# Patient Record
Sex: Female | Born: 1995 | Race: White | Hispanic: No | Marital: Single
Health system: Midwestern US, Community
[De-identification: ages and names within clinical notes are randomized; demographics above are authoritative.]

## PROBLEM LIST (undated history)

## (undated) DIAGNOSIS — M549 Dorsalgia, unspecified: Secondary | ICD-10-CM

## (undated) DIAGNOSIS — F909 Attention-deficit hyperactivity disorder, unspecified type: Secondary | ICD-10-CM

## (undated) DIAGNOSIS — F419 Anxiety disorder, unspecified: Secondary | ICD-10-CM

## (undated) HISTORY — PX: NO PAST SURGERIES: SHX2092

---
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
## 2018-12-19 NOTE — L&D Delivery Note (Signed)
Pt presented to L&D with AROM. She had pitocin augmentation started . She progressed along a nl labor curve. She had prolonged decel immediately after epidural placement. She pushed for 30 min and had a SVD of one live viable female infant over a 2nd degree midline epis in the ROA position. Placenta-S/I. EBL-400cc. Epis closed with 3-0 chromic. Baby to [facility]

## 2018-12-24 ENCOUNTER — Encounter (HOSPITAL_COMMUNITY): Payer: Self-pay | Admitting: Emergency Medicine

## 2018-12-24 ENCOUNTER — Emergency Department (HOSPITAL_COMMUNITY)
Admission: EM | Admit: 2018-12-24 | Discharge: 2018-12-24 | Disposition: A | Discharge status: Home / Self Care | Payer: Managed Care, Other (non HMO) | Attending: Emergency Medicine | Admitting: Emergency Medicine

## 2018-12-24 ENCOUNTER — Other Ambulatory Visit: Payer: Self-pay

## 2018-12-24 DIAGNOSIS — O26891 Other specified pregnancy related conditions, first trimester: Secondary | ICD-10-CM | POA: Diagnosis not present

## 2018-12-24 DIAGNOSIS — Z3A01 Less than 8 weeks gestation of pregnancy: Secondary | ICD-10-CM | POA: Diagnosis not present

## 2018-12-24 DIAGNOSIS — R1013 Epigastric pain: Secondary | ICD-10-CM | POA: Diagnosis not present

## 2018-12-24 LAB — CBC WITH DIFFERENTIAL/PLATELET
Abs Immature Granulocytes: 0.02 10*3/uL (ref 0.00–0.07)
BASOS ABS: 0 10*3/uL (ref 0.0–0.1)
Basophils Relative: 0 %
EOS ABS: 0.1 10*3/uL (ref 0.0–0.5)
EOS PCT: 1 %
HEMATOCRIT: 39.2 % (ref 36.0–46.0)
Hemoglobin: 12.8 g/dL (ref 12.0–15.0)
IMMATURE GRANULOCYTES: 0 %
LYMPHS ABS: 2.6 10*3/uL (ref 0.7–4.0)
LYMPHS PCT: 26 %
MCH: 28.6 pg (ref 26.0–34.0)
MCHC: 32.7 g/dL (ref 30.0–36.0)
MCV: 87.7 fL (ref 80.0–100.0)
Monocytes Absolute: 0.7 10*3/uL (ref 0.1–1.0)
Monocytes Relative: 7 %
NRBC: 0 % (ref 0.0–0.2)
Neutro Abs: 6.6 10*3/uL (ref 1.7–7.7)
Neutrophils Relative %: 66 %
PLATELETS: 256 10*3/uL (ref 150–400)
RBC: 4.47 MIL/uL (ref 3.87–5.11)
RDW: 13.3 % (ref 11.5–15.5)
WBC: 10 10*3/uL (ref 4.0–10.5)

## 2018-12-24 LAB — URINALYSIS, ROUTINE W REFLEX MICROSCOPIC
BILIRUBIN URINE: NEGATIVE
Glucose, UA: NEGATIVE mg/dL
Hgb urine dipstick: NEGATIVE
KETONES UR: NEGATIVE mg/dL
Leukocytes, UA: NEGATIVE
NITRITE: NEGATIVE
PH: 5 (ref 5.0–8.0)
Protein, ur: NEGATIVE mg/dL
SPECIFIC GRAVITY, URINE: 1.02 (ref 1.005–1.030)

## 2018-12-24 LAB — COMPREHENSIVE METABOLIC PANEL
ALBUMIN: 3.9 g/dL (ref 3.5–5.0)
ALT: 16 U/L (ref 0–44)
ANION GAP: 8 (ref 5–15)
AST: 16 U/L (ref 15–41)
Alkaline Phosphatase: 46 U/L (ref 38–126)
BUN: 12 mg/dL (ref 6–20)
CALCIUM: 8.7 mg/dL — AB (ref 8.9–10.3)
CHLORIDE: 107 mmol/L (ref 98–111)
CO2: 21 mmol/L — AB (ref 22–32)
CREATININE: 0.58 mg/dL (ref 0.44–1.00)
GFR calc Af Amer: >60 mL/min (ref >60)
GFR calc non Af Amer: >60 mL/min (ref >60)
GLUCOSE: 101 mg/dL — AB (ref 70–99)
POTASSIUM: 3.7 mmol/L (ref 3.5–5.1)
SODIUM: 136 mmol/L (ref 135–145)
Total Bilirubin: 0.9 mg/dL (ref 0.3–1.2)
Total Protein: 7 g/dL (ref 6.5–8.1)

## 2018-12-24 LAB — POC URINE PREG, ED: Preg Test, Ur: POSITIVE — AB

## 2018-12-24 LAB — LIPASE, BLOOD: Lipase: 34 U/L (ref 11–51)

## 2018-12-24 MED ORDER — SUCRALFATE 1 G PO TABS: 1.0000 g | ORAL_TABLET | Freq: Three times a day (TID) | ORAL | 0 refills | Status: DC

## 2018-12-24 MED ORDER — DOXYLAMINE-PYRIDOXINE 10-10 MG PO TBEC: 2.0000 | DELAYED_RELEASE_TABLET | Freq: Every evening | ORAL | 0 refills | Status: DC | PRN

## 2018-12-24 MED ORDER — ALUM & MAG HYDROXIDE-SIMETH 200-200-20 MG/5ML PO SUSP
15.0000 mL | Freq: Once | ORAL | Status: AC
  Administered 2018-12-24: 15 mL via ORAL
  Filled 2018-12-24: qty 30

## 2018-12-24 MED ORDER — SUCRALFATE 1 G PO TABS
1.0000 g | ORAL_TABLET | Freq: Once | ORAL | Status: AC
  Administered 2018-12-24: 1 g via ORAL
  Filled 2018-12-24: qty 1

## 2018-12-24 MED ORDER — ALUMINUM HYDROXIDE GEL 320 MG/5ML PO SUSP
15.0000 mL | Freq: Once | ORAL | Status: DC
  Filled 2018-12-24: qty 30

## 2018-12-24 NOTE — ED Triage Notes (Signed)
Pt from home with c/o abdominal pain (stabbing, sharp) since 0830 last night. Pt states she is pregnant 6-8 weeks, followed by OBGYN.  Pt c/o n/v for same time, emesis x3.  Denies vaginal bleeding.

## 2018-12-24 NOTE — Discharge Instructions (Addendum)
Go to a drugstore and buy unisom and vitaminb6(pyridoxine) Take 1/2 a tab of unisom(12.5mg ) and 25mg  of vitamin b6.  Take this at night before bed.  If you continue to have nausea and vomiting take twice a day.  [facility] or [facility].  Try the sucralfate.  Try to avoid things that may make this worse, most commonly these are spicy foods tomato based products fatty foods chocolate and peppermint.  Alcohol and tobacco can also make this worse.  Return to the emergency department for sudden worsening pain fever or inability to eat or drink.

## 2018-12-24 NOTE — ED Provider Notes (Signed)
[facility]-EMERGENCY DEPT Provider Note   CSN: [id number] Arrival date & time: 12/24/18  0903     History   Chief Complaint Chief Complaint  Patient presents with  . Abdominal Pain    HPI Catherine Tucker is a 23 y.o. female.  23 yo F with chief complaints of epigastric abdominal pain.  Going on since yesterday.  Patient has had some morning sickness.  She was recently confirmed IUP.  Her OBs office estimated about 6-1/2 weeks.  She denies lower abdominal pain vaginal bleeding or discharge.  The history is provided by the patient.  Abdominal Pain   This is a new problem. The current episode started yesterday. The problem occurs constantly. The problem has not changed since onset.The pain is located in the epigastric region. The quality of the pain is burning. The pain is at a severity of 6/10. The pain is moderate. Associated symptoms include nausea and vomiting. Pertinent negatives include fever, dysuria, headaches, arthralgias and myalgias. Nothing aggravates the symptoms. Nothing relieves the symptoms.    No past medical history on file.  There are no active problems to display for this patient.   No past surgical history on file.   OB History    Gravida  1   Para      Term      Preterm      AB      Living        SAB      TAB      Ectopic      Multiple      Live Births               Home Medications    Prior to Admission medications   Medication Sig Start Date End Date Taking? Authorizing Provider  Doxylamine-Pyridoxine 10-10 MG TBEC Take 2 tablets by mouth at bedtime as needed. 12/24/18   Melene PlanFloyd, Ajia Chadderdon, DO  sucralfate (CARAFATE) 1 g tablet Take 1 tablet (1 g total) by mouth 4 (four) times daily -  with meals and at bedtime. 12/24/18   Melene PlanFloyd, Anicia Leuthold, DO    Family History No family history on file.  Social History Social History   Tobacco Use  . Smoking status: Not on file  Substance Use Topics  . Alcohol use: Not on file  .  Drug use: Not on file     Allergies   Patient has no known allergies.   Review of Systems Review of Systems  Constitutional: Negative for chills and fever.  HENT: Negative for congestion and rhinorrhea.   Eyes: Negative for redness and visual disturbance.  Respiratory: Negative for shortness of breath and wheezing.   Cardiovascular: Negative for chest pain and palpitations.  Gastrointestinal: Positive for abdominal pain, nausea and vomiting.  Genitourinary: Negative for dysuria and urgency.  Musculoskeletal: Negative for arthralgias and myalgias.  Skin: Negative for pallor and wound.  Neurological: Negative for dizziness and headaches.     Physical Exam Updated Vital Signs BP 129/89   Pulse 72   Temp (!) 97.5 F (36.4 C) (Oral)   Ht 5\' 2"  (1.575 m)   Wt 53.1 kg   SpO2 100%   BMI 21.40 kg/m   Physical Exam Vitals signs and nursing note reviewed.  Constitutional:      General: She is not in acute distress.    Appearance: She is well-developed. She is not diaphoretic.  HENT:     Head: Normocephalic and atraumatic.  Eyes:  Pupils: Pupils are equal, round, and reactive to light.  Neck:     Musculoskeletal: Normal range of motion and neck supple.  Cardiovascular:     Rate and Rhythm: Normal rate and regular rhythm.     Heart sounds: No murmur. No friction rub. No gallop.   Pulmonary:     Effort: Pulmonary effort is normal.     Breath sounds: No wheezing or rales.  Abdominal:     General: There is no distension.     Palpations: Abdomen is soft.     Tenderness: There is abdominal tenderness in the epigastric area. Negative signs include Murphy's sign and McBurney's sign.  Musculoskeletal:        General: No tenderness.  Skin:    General: Skin is warm and dry.  Neurological:     Mental Status: She is alert and oriented to person, place, and time.  Psychiatric:        Behavior: Behavior normal.      ED Treatments / Results  Labs (all labs ordered are  listed, but only abnormal results are displayed) Labs Reviewed  COMPREHENSIVE METABOLIC PANEL - Abnormal; Notable for the following components:      Result Value   CO2 21 (*)    Glucose, Bld 101 (*)    Calcium 8.7 (*)    All other components within normal limits  POC URINE PREG, ED - Abnormal; Notable for the following components:   Preg Test, Ur POSITIVE (*)    All other components within normal limits  URINALYSIS, ROUTINE W REFLEX MICROSCOPIC  CBC WITH DIFFERENTIAL/PLATELET  LIPASE, BLOOD    EKG None  Radiology No results found.  Procedures Procedures (including critical care time)  Medications Ordered in ED Medications  sucralfate (CARAFATE) tablet 1 g (1 g Oral Given 12/24/18 0946)  alum & mag hydroxide-simeth (MAALOX/MYLANTA) 200-200-20 MG/5ML suspension 15 mL (15 mLs Oral Given 12/24/18 0946)     Initial Impression / Assessment and Plan / ED Course  I have reviewed the triage vital signs and the nursing notes.  Pertinent labs & imaging results that were available during my care of the patient were reviewed by me and considered in my medical decision making (see chart for details).     23 yo F with a cc of epigastric abdominal pain. Going on for the past couple days.  Recently pregnant, G1P0, some morning sickness but no other issues yet.  No prior medical problems.   Mild epigastric pain.  No RUQ tenderness.  Likely gastritis with frequent vomiting, will give maalox, sucralfate, lab eval.  Reassess.   Lab work here is unremarkable.  The patient has had some mild if any improvement with aluminum hydroxide and sucrafate.  10:31 AM:  I have discussed the diagnosis/risks/treatment options with the patient and family and believe the pt to be eligible for discharge home to follow-up with PCP. We also discussed returning to the ED immediately if new or worsening sx occur. We discussed the sx which are most concerning (e.g., sudden worsening pain, fever, inability to tolerate  by mouth) that necessitate immediate return. Medications administered to the patient during their visit and any new prescriptions provided to the patient are listed below.  Medications given during this visit Medications  sucralfate (CARAFATE) tablet 1 g (1 g Oral Given 12/24/18 0946)  alum & mag hydroxide-simeth (MAALOX/MYLANTA) 200-200-20 MG/5ML suspension 15 mL (15 mLs Oral Given 12/24/18 0946)      The patient appears reasonably screen and/or stabilized for  discharge and I doubt any other medical condition or other [facility] requiring further screening, evaluation, or treatment in the ED at this time prior to discharge.    Final Clinical Impressions(s) / ED Diagnoses   Final diagnoses:  Epigastric abdominal pain    ED Discharge Orders         Ordered    sucralfate (CARAFATE) 1 g tablet  3 times daily with meals & bedtime     12/24/18 1029    Doxylamine-Pyridoxine 10-10 MG TBEC  At bedtime PRN     12/24/18 1029           Melene PlanFloyd, Juleen Sorrels, DO 12/24/18 1031

## 2019-08-03 ENCOUNTER — Other Ambulatory Visit: Payer: Self-pay

## 2019-08-03 ENCOUNTER — Inpatient Hospital Stay (HOSPITAL_COMMUNITY): Payer: Managed Care, Other (non HMO) | Admitting: Anesthesiology

## 2019-08-03 ENCOUNTER — Encounter (HOSPITAL_COMMUNITY): Payer: Self-pay

## 2019-08-03 ENCOUNTER — Inpatient Hospital Stay (HOSPITAL_COMMUNITY)
Admission: AD | Admit: 2019-08-03 | Discharge: 2019-08-04 | DRG: 807 | Disposition: A | Discharge status: Home / Self Care | Payer: Managed Care, Other (non HMO) | Attending: Obstetrics and Gynecology | Admitting: Obstetrics and Gynecology

## 2019-08-03 DIAGNOSIS — O26893 Other specified pregnancy related conditions, third trimester: Secondary | ICD-10-CM | POA: Diagnosis present

## 2019-08-03 DIAGNOSIS — Z3490 Encounter for supervision of normal pregnancy, unspecified, unspecified trimester: Secondary | ICD-10-CM

## 2019-08-03 DIAGNOSIS — Z20828 Contact with and (suspected) exposure to other viral communicable diseases: Secondary | ICD-10-CM | POA: Diagnosis present

## 2019-08-03 DIAGNOSIS — Z3A39 39 weeks gestation of pregnancy: Secondary | ICD-10-CM

## 2019-08-03 HISTORY — DX: Dorsalgia, unspecified: M54.9

## 2019-08-03 HISTORY — DX: Anxiety disorder, unspecified: F41.9

## 2019-08-03 LAB — CBC
HCT: 36.5 % (ref 36.0–46.0)
Hemoglobin: 12.2 g/dL (ref 12.0–15.0)
MCH: 29.1 pg (ref 26.0–34.0)
MCHC: 33.4 g/dL (ref 30.0–36.0)
MCV: 87.1 fL (ref 80.0–100.0)
Platelets: 217 10*3/uL (ref 150–400)
RBC: 4.19 MIL/uL (ref 3.87–5.11)
RDW: 14.8 % (ref 11.5–15.5)
WBC: 10.4 10*3/uL (ref 4.0–10.5)
nRBC: 0 % (ref 0.0–0.2)

## 2019-08-03 LAB — TYPE AND SCREEN
ABO/RH(D): O POS
Antibody Screen: NEGATIVE

## 2019-08-03 LAB — SARS CORONAVIRUS 2 BY RT PCR (HOSPITAL ORDER, PERFORMED IN ~~LOC~~ HOSPITAL LAB): SARS Coronavirus 2: NEGATIVE

## 2019-08-03 LAB — ABO/RH: ABO/RH(D): O POS

## 2019-08-03 LAB — POCT FERN TEST: POCT Fern Test: POSITIVE

## 2019-08-03 MED ORDER — FLEET ENEMA 7-19 GM/118ML RE ENEM: 1.0000 | ENEMA | RECTAL | Status: DC | PRN

## 2019-08-03 MED ORDER — OXYCODONE-ACETAMINOPHEN 5-325 MG PO TABS: 2.0000 | ORAL_TABLET | ORAL | Status: DC | PRN

## 2019-08-03 MED ORDER — IBUPROFEN 600 MG PO TABS
600.0000 mg | ORAL_TABLET | Freq: Four times a day (QID) | ORAL | Status: DC
  Administered 2019-08-03 – 2019-08-04 (×4): 600 mg via ORAL
  Filled 2019-08-03 (×4): qty 1

## 2019-08-03 MED ORDER — OXYCODONE-ACETAMINOPHEN 5-325 MG PO TABS: 1.0000 | ORAL_TABLET | ORAL | Status: DC | PRN

## 2019-08-03 MED ORDER — LIDOCAINE HCL (PF) 1 % IJ SOLN
30.0000 mL | INTRAMUSCULAR | Status: DC | PRN
  Administered 2019-08-03: 17:00:00 30 mL via SUBCUTANEOUS
  Filled 2019-08-03: qty 30

## 2019-08-03 MED ORDER — OXYTOCIN 40 UNITS IN NORMAL SALINE INFUSION - SIMPLE MED
1.0000 m[IU]/min | INTRAVENOUS | Status: DC
  Administered 2019-08-03: 2 m[IU]/min via INTRAVENOUS

## 2019-08-03 MED ORDER — SENNOSIDES-DOCUSATE SODIUM 8.6-50 MG PO TABS
2.0000 | ORAL_TABLET | ORAL | Status: DC
  Administered 2019-08-04: 2 via ORAL
  Filled 2019-08-03: qty 2

## 2019-08-03 MED ORDER — TERBUTALINE SULFATE 1 MG/ML IJ SOLN: 0.2500 mg | Freq: Once | INTRAMUSCULAR | Status: DC | PRN

## 2019-08-03 MED ORDER — OXYTOCIN 40 UNITS IN NORMAL SALINE INFUSION - SIMPLE MED: 2.5000 [IU]/h | INTRAVENOUS | Status: DC

## 2019-08-03 MED ORDER — EPHEDRINE 5 MG/ML INJ
10.0000 mg | INTRAVENOUS | Status: DC | PRN
  Filled 2019-08-03: qty 10

## 2019-08-03 MED ORDER — COCONUT OIL OIL: 1.0000 "application " | TOPICAL_OIL | Status: DC | PRN

## 2019-08-03 MED ORDER — OXYTOCIN BOLUS FROM INFUSION: 500.0000 mL | Freq: Once | INTRAVENOUS | Status: DC

## 2019-08-03 MED ORDER — DIBUCAINE (PERIANAL) 1 % EX OINT: 1.0000 "application " | TOPICAL_OINTMENT | CUTANEOUS | Status: DC | PRN

## 2019-08-03 MED ORDER — PHENYLEPHRINE 40 MCG/ML (10ML) SYRINGE FOR IV PUSH (FOR BLOOD PRESSURE SUPPORT)
80.0000 ug | PREFILLED_SYRINGE | INTRAVENOUS | Status: DC | PRN
  Administered 2019-08-03: 80 ug via INTRAVENOUS
  Filled 2019-08-03: qty 10

## 2019-08-03 MED ORDER — SIMETHICONE 80 MG PO CHEW: 80.0000 mg | CHEWABLE_TABLET | ORAL | Status: DC | PRN

## 2019-08-03 MED ORDER — LACTATED RINGERS IV SOLN: 500.0000 mL | INTRAVENOUS | Status: DC | PRN

## 2019-08-03 MED ORDER — EPHEDRINE 5 MG/ML INJ
10.0000 mg | INTRAVENOUS | Status: DC | PRN
  Administered 2019-08-03: 15:00:00 10 mg via INTRAVENOUS

## 2019-08-03 MED ORDER — SODIUM CHLORIDE (PF) 0.9 % IJ SOLN
INTRAMUSCULAR | Status: DC | PRN
  Administered 2019-08-03: 12 mL/h via EPIDURAL

## 2019-08-03 MED ORDER — LACTATED RINGERS IV SOLN
INTRAVENOUS | Status: DC
  Administered 2019-08-03 (×2): via INTRAVENOUS

## 2019-08-03 MED ORDER — ONDANSETRON HCL 4 MG/2ML IJ SOLN: 4.0000 mg | Freq: Four times a day (QID) | INTRAMUSCULAR | Status: DC | PRN

## 2019-08-03 MED ORDER — FENTANYL-BUPIVACAINE-NACL 0.5-0.125-0.9 MG/250ML-% EP SOLN
12.0000 mL/h | EPIDURAL | Status: DC | PRN
  Filled 2019-08-03: qty 250

## 2019-08-03 MED ORDER — SOD CITRATE-CITRIC ACID 500-334 MG/5ML PO SOLN: 30.0000 mL | ORAL | Status: DC | PRN

## 2019-08-03 MED ORDER — FENTANYL CITRATE (PF) 100 MCG/2ML IJ SOLN
100.0000 ug | INTRAMUSCULAR | Status: DC | PRN
  Administered 2019-08-03: 14:00:00 100 ug via INTRAVENOUS
  Filled 2019-08-03: qty 2

## 2019-08-03 MED ORDER — ZOLPIDEM TARTRATE 5 MG PO TABS: 5.0000 mg | ORAL_TABLET | Freq: Every evening | ORAL | Status: DC | PRN

## 2019-08-03 MED ORDER — TETANUS-DIPHTH-ACELL PERTUSSIS 5-2.5-18.5 LF-MCG/0.5 IM SUSP: 0.5000 mL | Freq: Once | INTRAMUSCULAR | Status: DC

## 2019-08-03 MED ORDER — PHENYLEPHRINE 40 MCG/ML (10ML) SYRINGE FOR IV PUSH (FOR BLOOD PRESSURE SUPPORT)
80.0000 ug | PREFILLED_SYRINGE | INTRAVENOUS | Status: DC | PRN
  Administered 2019-08-03: 80 ug via INTRAVENOUS

## 2019-08-03 MED ORDER — ONDANSETRON HCL 4 MG PO TABS: 4.0000 mg | ORAL_TABLET | ORAL | Status: DC | PRN

## 2019-08-03 MED ORDER — ONDANSETRON HCL 4 MG/2ML IJ SOLN: 4.0000 mg | INTRAMUSCULAR | Status: DC | PRN

## 2019-08-03 MED ORDER — WITCH HAZEL-GLYCERIN EX PADS: 1.0000 "application " | MEDICATED_PAD | CUTANEOUS | Status: DC | PRN

## 2019-08-03 MED ORDER — LACTATED RINGERS IV SOLN: 500.0000 mL | Freq: Once | INTRAVENOUS | Status: DC

## 2019-08-03 MED ORDER — ACETAMINOPHEN 325 MG PO TABS: 650.0000 mg | ORAL_TABLET | ORAL | Status: DC | PRN

## 2019-08-03 MED ORDER — BENZOCAINE-MENTHOL 20-0.5 % EX AERO
1.0000 "application " | INHALATION_SPRAY | CUTANEOUS | Status: DC | PRN
  Filled 2019-08-03: qty 56

## 2019-08-03 MED ORDER — DIPHENHYDRAMINE HCL 50 MG/ML IJ SOLN: 12.5000 mg | INTRAMUSCULAR | Status: DC | PRN

## 2019-08-03 MED ORDER — MEASLES, MUMPS & RUBELLA VAC IJ SOLR: 0.5000 mL | Freq: Once | INTRAMUSCULAR | Status: DC

## 2019-08-03 MED ORDER — LIDOCAINE HCL (PF) 1 % IJ SOLN
INTRAMUSCULAR | Status: DC | PRN
  Administered 2019-08-03: 11 mL via EPIDURAL

## 2019-08-03 MED ORDER — OXYTOCIN 40 UNITS IN NORMAL SALINE INFUSION - SIMPLE MED
INTRAVENOUS | Status: AC
  Filled 2019-08-03: qty 1000
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
## 2019-08-03 NOTE — Anesthesia Procedure Notes (Signed)
Epidural Patient location during procedure: OB Start time: 08/03/2019 2:47 PM End time: 08/03/2019 3:01 PM  Staffing Anesthesiologist: Lynda Rainwater, MD Performed: anesthesiologist   Preanesthetic Checklist Completed: patient identified, site marked, surgical consent, pre-op evaluation, timeout performed, IV checked, risks and benefits discussed and monitors and equipment checked  Epidural Patient position: sitting Prep: ChloraPrep Patient monitoring: heart rate, cardiac monitor, continuous pulse ox and blood pressure Approach: midline Location: L2-L3 Injection technique: LOR saline  Needle:  Needle type: Tuohy  Needle gauge: 17 G Needle length: 9 cm Needle insertion depth: 4 cm Catheter type: closed end flexible Catheter size: 20 Guage Catheter at skin depth: 8 cm Test dose: negative  Assessment Events: blood not aspirated, injection not painful, no injection resistance, negative IV test and no paresthesia  Additional Notes Reason for block:procedure for pain

## 2019-08-03 NOTE — MAU Note (Signed)
Went to office on Wednesday -was 5.5 cm but not having contractions. Water broke at 2000. Clear fluid.  Baby moving well. No bleeding.

## 2019-08-03 NOTE — Anesthesia Preprocedure Evaluation (Signed)

## 2019-08-04 LAB — RPR: RPR Ser Ql: NONREACTIVE

## 2019-08-04 MED ORDER — IBUPROFEN 600 MG PO TABS: 600.0000 mg | ORAL_TABLET | Freq: Four times a day (QID) | ORAL | 0 refills | Status: DC

## 2019-08-04 NOTE — Lactation Note (Signed)
This note was copied from a baby's chart. Lactation Consultation Note  Patient Name: Catherine Tucker GYBWL'S Date: 08/04/2019 Reason for consult: Follow-up assessment;Infant weight loss;Term;Primapara;1st time breastfeeding Baby is 10 hours old , mom desires early D/C today.  As LC entered the room baby  latched on the cross cradle With swallows , increased with breast compressions.  Per mom more comfortable after the folded up towels placed  Under the baby without disturbing the latch. Baby released  After 30 mins, nipple well rounded and mom expressed the latch  Was better than the baby has been doing.  LC assisted mom to latch on the left breast / football/ depth achieved  And increased swallows, baby released after 5 mins , fell asleep, and Nipple well rounded. Mom mentioned the latch was comfortable.  LC reviewed basics of latching , importance of STS feedings until the  Baby is back to birth weight,gaining steadily, and can stay awake for majority of feeding,  Sore nipple and engorgement prevention and tx reviewed.  Mom aware of the [facility] resources at [facility] .  Maternal Data Has patient been taught Hand Expression?: Yes  Feeding Feeding Type: Breast Fed(latched with multiple swallows)  LATCH Score Latch: Grasps breast easily, tongue down, lips flanged, rhythmical sucking.  Audible Swallowing: Spontaneous and intermittent  Type of Nipple: Everted at rest and after stimulation  Comfort (Breast/Nipple): Soft / non-tender  Hold (Positioning): Assistance needed to correctly position infant at breast and maintain latch.  LATCH Score: 9  Interventions Interventions: Breast feeding basics reviewed;Assisted with latch;Skin to skin;Breast massage;Hand express;Breast compression;Adjust position;Support pillows;Position options;Shells;Hand pump  Lactation Tools Discussed/Used Tools: Pump;Shells Shell Type: Inverted Breast pump type: Manual   Consult Status Consult Status:  Follow-up Date: 08/04/19 Follow-up type: In-patient    [place] 08/04/2019, 2:59 PM

## 2019-08-04 NOTE — Discharge Summary (Signed)
Obstetric Discharge Summary Reason for Admission: rupture of membranes Prenatal Procedures: NST Intrapartum Procedures: spontaneous vaginal delivery Postpartum Procedures: none Complications-Operative and Postpartum: 2nd midline epis Hemoglobin  Date Value Ref Range Status  08/03/2019 12.2 12.0 - 15.0 g/dL Final   HCT  Date Value Ref Range Status  08/03/2019 36.5 36.0 - 46.0 % Final    Physical Exam:  General: alert and cooperative Lochia: appropriate   Discharge Diagnoses: Term Pregnancy-delivered  Discharge Information: Date: 08/04/2019 Activity: pelvic rest Diet: routine Medications: PNV and Ibuprofen Condition: stable Instructions: refer to practice specific booklet Discharge to: home   Newborn Data: Live born female  Birth Weight: 6 lb 14.6 oz (3135 g) APGAR: 8, 9  Newborn Delivery   Birth date/time: 08/03/2019 16:39:00 Delivery type: Vaginal, Spontaneous      Home with mother.  [place] E 08/04/2019, 11:27 AM

## 2019-08-04 NOTE — Progress Notes (Signed)
PPD#1 Pt without complaints. Lochia mild VSSAF  Hgb-ok Does not desire circ IMP/ Stable Plan/ Will discharge

## 2019-08-04 NOTE — Clinical Social Work Maternal (Addendum)
CLINICAL SOCIAL WORK MATERNAL/CHILD NOTE  Patient Details  Name: Catherine Tucker MRN: [id number] Date of Birth: 03/22/1996  Date:  07/10/2019  Clinical Social Worker Initiating Note:  Catherine Tucker Date/Time: Initiated:  08/04/19/1145     Child's Name:  Catherine Tucker   Biological Parents:  Mother, Father(Catherine Tucker and Catherine Tucker DOB: 01/23/1996)   Need for Interpreter:  None   Reason for Referral:  Behavioral Health Concerns, Current Substance Use/Substance Use During Pregnancy    Address:  [address] [place] [address]    Phone number:  [phone] (home)     Additional phone number:   Household Members/Support Persons (HM/SP):   Household Member/Support Person 1   HM/SP Name Relationship DOB or Age  HM/SP -1   FOB    HM/SP -2        HM/SP -3        HM/SP -4        HM/SP -5        HM/SP -6        HM/SP -7        HM/SP -8          Natural Supports (not living in the home):  Parent, Immediate Family   Professional Supports: None   Employment: Animator   Type of Work: Medical illustrator with Airline pilot:  Attending college   Homebound arranged:    Museum/gallery curator Resources:  Multimedia programmer   Other Resources:      Cultural/Religious Considerations Which May Impact Care:    Strengths:  Ability to meet basic needs , Home prepared for child , Pediatrician chosen   Psychotropic Medications:         Pediatrician:    [place]olic[place] area  Pediatrician List:   [facility]  [place]      Pediatrician Fax Number:    Risk Factors/Current Problems:      Cognitive State:  Able to Concentrate , Alert , Linear Thinking    Mood/Affect:  Bright , Calm , Comfortable , Interested , Happy , Relaxed    CSW Assessment:  CSW received consult for history of anxiety and THC use.  CSW met with MOB to offer support and  complete assessment.    MOB resting in bed just finishing up breastfeeding infant with FOB present at bedside, when CSW entered the room. CSW introduced self and received verbal permission from MOB to have FOB step out of the room for CSW to complete assessment. FOB understanding and left voluntarily. MOB very pleasant and engaged throughout assessment. MOB appeared to be well-bonded and attached to infant during visit. MOB reported she currently lives with FOB/fiance in [place]. MOB stated she is still in college and works full-time as an Medical illustrator for [employer]. CSW inquired about MOB's mental health history and MOB denied having any but did acknowledge being diagnosed with ADHD. CSW provided education regarding the baby blues period vs. perinatal mood disorders, discussed treatment and gave resources for mental health follow up if concerns arise.  CSW recommends self-evaluation during the postpartum time period using the New Mom Checklist from Postpartum Progress and encouraged MOB to contact a medical professional if symptoms are noted at any time. MOB denied any current SI, HI or DV and reported having a good support system consisting of her mother, father and step-father.  CSW inquired  about MOB's substance use history and MOB acknowledged using marijuana in the beginning of her pregnancy but reported last use was around Thanksgiving. Per MOB, she has been around friends who smoke marijuana but denied any use herself. CSW informed MOB of Hospital Drug Policy and explained infant's UDS came back positive for THC. CSW explained that a CPS report would need to be made and detailed process. MOB very understanding and open to having CPS come meet with her and do a home visit. MOB denied any questions or concerns regarding policy.   MOB confirmed having all essential items for infant once discharged and reported infant would be sleeping in a basinet once home. CSW provided review of  Sudden Infant Death Syndrome (SIDS) precautions and safe sleeping habits.    CSW made [facility] CPS report due to infant's UDS being positive for THC. No barriers to discharge, at this time. CPS to follow up with MOB within 72 hours.   CSW Plan/Description:  No Further Intervention Required/No Barriers to Discharge, Sudden Infant Death Syndrome (SIDS) Education, Perinatal Mood and Anxiety Disorder (PMADs) Education, [place], Child Protective Service Report , CSW Will Continue to Monitor Umbilical Cord Tissue Drug Screen Results and Make Report if Catherine Tucker, [place] 03/02/2019, 12:12 PM

## 2019-08-04 NOTE — Lactation Note (Signed)
This note was copied from a baby's chart. Lactation Consultation Note  Patient Name: Catherine Tucker Date: 08/04/2019 Reason for consult: Initial assessment;Primapara;1st time breastfeeding;Term  7 hours old FT female who is being exclusively BF by his mother, she's a P1. Mom has Hx of MJ use, reported (+) breast changes during the pregnancy. [facility] taught mom how to hand express with the C hold. Mom able to teach back and she can easily get colostrum, LC showed parents how to finger feed baby. She has a hand pump from home that she brought to the hospital.  Offered assistance with latch and mom agreed to have baby STS; he wasn't cueing but was on his quiet alert state. LC took baby in football position to mother's right breast and he was able to latch with a deep asymmetric latch, wide open mouth and flanged lips. Reviewed key points for a deep latch. A few audible swallows noted upon breast compressions. Baby fell asleep shortly at the 5 minutes mark and self released from the breast.  Parents were very engaged during [facility] consultation and had lots of questions. Discussed normal newborn behavior, feeding cues, cluster feeding, lactogenesis II, pumping schedule and formula supplementation, mom's feeding choice on admission is to do both, breast and formula but she's planning on withhold from formula for now, at least till baby is 21 hours old. LC responded all parents questions to their satisfaction.  Feeding plan:  1. Encouraged mom to feed baby STS 8-12 times/24 hours or sooner if feeding cues are present 2. Hand expression/pumping and spoon/finger feeding were also encouraged  BF brochure, BF resources and feeding diary were reviewed. Parents reported all questions and concerns were answered, they're both aware of [place] OP services and will call PRN.  Maternal Data Formula Feeding for Exclusion: Yes Reason for exclusion: Mother's choice to formula and breast feed on admission Has patient been  taught Hand Expression?: Yes Does the patient have breastfeeding experience prior to this delivery?: No  Feeding Feeding Type: Breast Fed  LATCH Score Latch: Repeated attempts needed to sustain latch, nipple held in mouth throughout feeding, stimulation needed to elicit sucking reflex.  Audible Swallowing: A few with stimulation(with compressions)  Type of Nipple: Everted at rest and after stimulation  Comfort (Breast/Nipple): Soft / non-tender  Hold (Positioning): Assistance needed to correctly position infant at breast and maintain latch.  LATCH Score: 7  Interventions Interventions: Breast feeding basics reviewed;Assisted with latch;Skin to skin;Breast massage;Hand express;Breast compression;Adjust position;Support pillows  Lactation Tools Discussed/Used WIC Program: No   Consult Status Consult Status: Follow-up Date: 08/04/19 Follow-up type: In-patient    [place] 08/04/2019, 12:12 AM

## 2019-08-04 NOTE — Progress Notes (Signed)
CSW acknowledged consult and attempted to meet with MOB. However, MOB noted to be breastfeeding and requested CSW come back later.  CSW will meet with MOB at a later time.  Ndia Sampath, LCSWA  [facility] [phone]     

## 2019-08-05 ENCOUNTER — Ambulatory Visit: Payer: Self-pay

## 2019-08-05 NOTE — Lactation Note (Signed)
This note was copied from a baby's chart. Lactation Consultation Note  Patient Name: Catherine Tucker TXMIW'O Date: 08/05/2019 Reason for consult: Follow-up assessment;Infant weight loss;1st time breastfeeding;Primapara;Term  Visited with mom of a 77 hours old FT female who is now being partially BF and formula fed by his mother, she's a P1. Baby is at 9% weight loss and today but mom started supplementing with formula yesterday when baby started cluster feeding. Mom and baby won't be going home today but most likely they will tomorrow.  Baby already nursing when entering the room, LC assessed the latch and it was OK but could be deeper. Repositioned baby and he got a wider gap and kept sucking at the breast with a few audible swallows noted. After that baby self released; dad proceeded to burp baby, but asked LC to show them again how to do it. LC showed them two different ways of burping, parents aware that baby is at 9% weight loss and they will continue supplementing with Similac 20 after feeding at the breast. Reviewed formula supplementation guidelines according to baby's age in hours, normal newborn behavior and feeding cues.  Parents also requested assistance with formula supplementation, LC showed parents how to syringe feed baby, he took 12 ml of Similac 20 calorie formula. Mom may switch to a slow flow nipple as an alternative method prior her discharge just to get familiar with it.   Feeding plan:  1. Encouraged mom to feed baby STS 8-12 times/24 hours or sooner if feeding cues are present 2. Mom will start using her hand pump after feeding, especially if offering baby a formula feeding 3. Parents will supplement with mom's EBM first prior offering any formula, 18-25 ml per feeding  Parents reported all questions and concerns were answered, they're both aware of [place] OP services and will call PRN.  Maternal Data    Feeding Feeding Type: Formula  LATCH Score Latch: Grasps breast  easily, tongue down, lips flanged, rhythmical sucking.  Audible Swallowing: A few with stimulation  Type of Nipple: Everted at rest and after stimulation  Comfort (Breast/Nipple): Soft / non-tender  Hold (Positioning): Assistance needed to correctly position infant at breast and maintain latch.  LATCH Score: 8  Interventions Interventions: Breast feeding basics reviewed;Assisted with latch;Skin to skin;Breast massage;Hand express;Breast compression;Adjust position;Support pillows;Hand pump  Lactation Tools Discussed/Used Tools: Pump Breast pump type: Manual   Consult Status Consult Status: Follow-up Date: 08/06/19 Follow-up type: In-patient    Neima Lacross Francene Boyers 08/05/2019, 12:08 PM

## 2019-08-06 ENCOUNTER — Ambulatory Visit: Payer: Self-pay

## 2019-08-06 NOTE — Anesthesia Postprocedure Evaluation (Signed)
Anesthesia Post Note  Patient: Catherine Tucker  Procedure(s) Performed: AN AD HOC LABOR EPIDURAL     Patient location during evaluation: Mother Baby Anesthesia Type: Epidural Level of consciousness: awake and alert Pain management: pain level controlled Vital Signs Assessment: post-procedure vital signs reviewed and stable Respiratory status: spontaneous breathing, nonlabored ventilation and respiratory function stable Cardiovascular status: stable Postop Assessment: no headache, no backache and epidural receding Anesthetic complications: no    Last Vitals:  Vitals:   08/04/19 0808 08/04/19 1440  BP: 104/66 121/85  Pulse: 77 74  Resp: 18 18  Temp: 36.9 C 36.8 C  SpO2: 98% 99%    Last Pain:  Vitals:   08/04/19 1440  TempSrc: Oral  PainSc:    Pain Goal: Patients Stated Pain Goal: 5 (08/03/19 0601)                 Lynda Rainwater

## 2019-08-06 NOTE — Lactation Note (Signed)
This note was copied from a baby's chart. Lactation Consultation Note  Patient Name: Catherine Tucker MCNOB'S Date: 08/06/2019 Reason for consult: Follow-up assessment;Primapara;1st time breastfeeding;Term;Infant weight loss;Other (Comment)(gained 24 grams since yesterday/ milk is in see [place] note)  Baby is 45 hours old  Gained weight over night , milk is in.  pe rmom has been using the curved tips syringe for supplementing and LC explored the 4F  SNS with mom / dad and they were receptive.  LC reviewed steps for latching and guided mom and also showed dad how he  Could help with the 5 F SNS after the baby was latched.  Baby had eaten 7 mins prior to the SNS and didn't seem hungry - took 3 ml of the EBM.  Mom expressed she felt good about the sore nipple and engorgement prevention and tx. LC reminded mom to use her shells between feedings except when sleeping.  LC stressed to mom and dad is the 4F SNS is challenging to use to supplement back with a medium based Dr. Owens Shark nipple - PACE Feeding.  Per mom plans to buy a DEBP Medela today due to her insurance not covering it.  LC offered to request and LC O/P for this week and mom and dad receptive.  LC placed a request in the [facility] O/P epic basket.     Maternal Data Has patient been taught Hand Expression?: Yes Does the patient have breastfeeding experience prior to this delivery?: No  Feeding Feeding Type: Breast Milk  LATCH Score Latch: Grasps breast easily, tongue down, lips flanged, rhythmical sucking.  Audible Swallowing: Spontaneous and intermittent  Type of Nipple: Everted at rest and after stimulation  Comfort (Breast/Nipple): Filling, red/small blisters or bruises, mild/mod discomfort  Hold (Positioning): Assistance needed to correctly position infant at breast and maintain latch.  LATCH Score: 8  Interventions Interventions: Breast feeding basics reviewed;Assisted with latch;Skin to skin;Breast massage;Hand express;Breast  compression;Adjust position;Support pillows;Position options;Shells;Hand pump;DEBP  Lactation Tools Discussed/Used Tools: Shells;Pump;Flanges;4F feeding tube / Syringe Flange Size: 24 Shell Type: Inverted Breast pump type: Double-Electric Breast Pump;Manual WIC Program: No Pump Review: Milk Storage;Setup, frequency, and cleaning(reviewed)   Consult Status Consult Status: Follow-up(LC offered mom to request a LC appt O/P for thsi week and mom receptive) Date: ([place] placed a request) Follow-up type: [place] 08/06/2019, 9:55 AM

## 2019-08-09 ENCOUNTER — Ambulatory Visit: Payer: Self-pay

## 2019-08-09 NOTE — Lactation Note (Signed)
This note was copied from a baby's chart. Lactation Consultation Note  Patient Name: Catherine Tucker [id number] Date: 08/09/2019     08/09/2019  Name: Catherine Tucker MRN: [id number] Date of Birth: 08/03/2018 Gestational Age: Gestational Age: [redacted]w[redacted]d Birth Weight: 110.6 oz Weight today:    6 pounds 7.2 ounces (2926 grams) with clean newborn diaper  Catherine Tucker is a 52 day old term infant that presents for feeding assessment. Infant is sleepy at the breast and mom has started supplementing with a bottle of pumped breast milk.  Infant has gained 61 grams in the last 3 days with an average daily weight gain of 10 grams a day.   Infant is self awakening during the day every 2-3 hours and mom is awakening infant during the night. Infant is BF STS with each feeding and mom is offering a bottle after each feeding. Infant takes 1-2.5 ounces per feeding in the bottle. Parents are using the Avima bottles for feeding, infant is not choking or drooling on the bottle. Mom has ordered Dr. Roosvelt Harps and they have not come in yet.   Mom is pumping about every 3 hours and getting up to 5 ounces per feeding. She is using to feed infant and to store the milk.   Infant latched to the left breast by mom. Infant with cheek dimpling. Assisted mom with deepening latch. Nipple was asymmetrical post latch when infant shallow. Infant still with some cheek dimpling after deepened latch but much better. Infant transferred well at the breast today. He was sleepy and needed some stimulation to maintain active feeding, parents did well with stimulation.   Infant with thin labial frenulum that inserts near the bottom of the gum ridge. Upper lip flanges well on the breast and with manual flanging. Infant with thin short lingual frenulum. Tongue with good mobility, although noted to have some decreased mid tongue elevation . Infant with strong suckle on gloved finger with good tongue cupping. Nipple rounded post feeding if infant latched  deeply to the breast. Will reassess at then next feeding assessment.   Infant to follow up with TAPM today for weight check. Infant to follow up with Lactation in 1 week.      General Information: Mother's reason for visit: Feeding assessment Consult: Initial Lactation consultant: Nonah Mattes RN,IBCLC Breastfeeding experience: BF is improving, infant being supplemented with each feeding   Maternal medications: Pre-natal vitamin  Breastfeeding History: Frequency of breast feeding: every 2-3 hours Duration of feeding: 15 minutes, usually one breast  Supplementation: Supplement method: bottle(Avima)         Breast milk volume: 1-2.5 ounces Breast milk frequency: every 2-3 hours   Pump type: Spectra Pump frequency: every 2-3 hours Pump volume: 2.5-5 ounces per pumping  Infant Output Assessment: Voids per 24 hours: 8-10 Urine color: Clear yellow Stools per 24 hours: 10 Stool color: Yellow  Breast Assessment: Breast: Filling, Compressible, Hyperplasia Nipple: Erect Pain level: 0 Pain interventions: Bra, Breast pump, Warm packs, Expressed breast milk, Other, Sore nipple shells(Berts Bees Nipple Butter)  Feeding Assessment: Infant oral assessment: Variance Infant oral assessment comment: see note Positioning: Cross cradle(20 minutes, left breast) Latch: 1 - Repeated attempts needed to sustain latch, nipple held in mouth throughout feeding, stimulation needed to elicit sucking reflex. Audible swallowing: 2 - Spontaneous and intermittent Type of nipple: 2 - Everted at rest and after stimulation Comfort: 2 - Soft/non-tender Hold: 1 - Assistance needed to correctly position infant at breast and maintain latch LATCH score: 8  Latch assessment: Deep(with assistance) Lips flanged: No(upper lip needs flanging) Suck assessment: Displays both Tools: Pump Pre-feed weight: 2926 grams Post feed weight: 2996 grams Amount transferred: 70 ml    Additional Feeding Assessment:                                     Totals: Total amount transferred: 70 ml Total supplement given: 0 Total amount pumped post feed: 5 ounces   Plan:   1. Offer infant the breast with feeding cues  2. Flanged infant lips after latch as needed, relatch if infant with shallow latch 3. Keep infant awake at the breast as needed , feed infant skin to skin and stimulate as needed 4. Massage/compress breast with feeding as needed to keep infant awake at the breast 5. Empty one breast before offering second breast 6. Offer infant a bottle of pumped breast milk after breast feeding if he is still cueing to feed or is very sleepy with the feeding and breast is not softening with feeding 7. When offering the bottle, feed infant using the paced bottle feeding method (video on [email or web address].[email or web address]) 8. Infant needs about 54-73 ml (2-2.5 ounces) for 8 feedings a day or 435-580 ml (15-19 ounces) in 24 hours. Infant may take more or less per feeding depending on how often he feeds. Feed infant until he is satisfied.  9. Would recommend that you continue to pump every 2-3 hours after he breast feeds until he is transferring better at the breast. It is recommended that you pump every time infant gets a bottle and for comfort as needed. Continue to use your hands free bra and massage breasts with feeding.  10. Keep up the good work 11. Thank you for allowing me to assist you today 12. Please call with any questions/concerns as needed [phone] 13. Follow up with Lactation in week    Ed BlalockSharon S Hannahmarie Asberry RN, IBCLC                                                    Catherine FloodSharon S Cielo Tucker 08/09/2019, 8:40 AM

## 2019-10-15 ENCOUNTER — Ambulatory Visit: Payer: Self-pay | Admitting: Adult Health

## 2019-10-16 ENCOUNTER — Encounter: Payer: Self-pay | Admitting: Adult Health

## 2019-10-16 ENCOUNTER — Ambulatory Visit (INDEPENDENT_AMBULATORY_CARE_PROVIDER_SITE_OTHER): Payer: Managed Care, Other (non HMO) | Admitting: Adult Health

## 2019-10-16 ENCOUNTER — Other Ambulatory Visit: Payer: Self-pay

## 2019-10-16 DIAGNOSIS — F429 Obsessive-compulsive disorder, unspecified: Secondary | ICD-10-CM

## 2019-10-16 DIAGNOSIS — F909 Attention-deficit hyperactivity disorder, unspecified type: Secondary | ICD-10-CM

## 2019-10-16 DIAGNOSIS — F411 Generalized anxiety disorder: Secondary | ICD-10-CM | POA: Diagnosis not present

## 2019-10-16 MED ORDER — LISDEXAMFETAMINE DIMESYLATE 30 MG PO CAPS: 30.0000 mg | ORAL_CAPSULE | Freq: Every day | ORAL | 0 refills | Status: DC
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
## 2019-10-18 ENCOUNTER — Other Ambulatory Visit: Payer: Self-pay

## 2019-10-18 ENCOUNTER — Telehealth: Payer: Self-pay | Admitting: Adult Health

## 2019-10-18 ENCOUNTER — Other Ambulatory Visit: Payer: Self-pay | Admitting: Psychiatry

## 2019-10-18 DIAGNOSIS — F909 Attention-deficit hyperactivity disorder, unspecified type: Secondary | ICD-10-CM

## 2019-10-18 MED ORDER — LISDEXAMFETAMINE DIMESYLATE 30 MG PO CAPS: 30.0000 mg | ORAL_CAPSULE | Freq: Every day | ORAL | 0 refills | Status: DC

## 2019-10-18 NOTE — Telephone Encounter (Signed)
Mom came in and said that the [facility] will not take her insurance so cancel the script of vyvanse 30mg  [facility]

## 2019-10-18 NOTE — Telephone Encounter (Signed)
Done

## 2019-10-21 ENCOUNTER — Telehealth: Payer: Self-pay

## 2019-10-21 DIAGNOSIS — F909 Attention-deficit hyperactivity disorder, unspecified type: Secondary | ICD-10-CM

## 2019-10-21 NOTE — Telephone Encounter (Signed)
Prior authorization submitted and approved for Vyvanse 30 mg effective 10/21/2019-10/20/2022 with [employer]

## 2019-10-31 ENCOUNTER — Encounter (HOSPITAL_COMMUNITY): Payer: Self-pay

## 2019-11-11 ENCOUNTER — Ambulatory Visit: Payer: Self-pay | Admitting: Adult Health

## 2019-12-31 ENCOUNTER — Other Ambulatory Visit: Payer: Self-pay

## 2019-12-31 ENCOUNTER — Ambulatory Visit (INDEPENDENT_AMBULATORY_CARE_PROVIDER_SITE_OTHER): Payer: 59 | Admitting: Adult Health

## 2019-12-31 ENCOUNTER — Encounter: Payer: Self-pay | Admitting: Adult Health

## 2019-12-31 DIAGNOSIS — F429 Obsessive-compulsive disorder, unspecified: Secondary | ICD-10-CM | POA: Diagnosis not present

## 2019-12-31 DIAGNOSIS — F411 Generalized anxiety disorder: Secondary | ICD-10-CM

## 2019-12-31 DIAGNOSIS — F909 Attention-deficit hyperactivity disorder, unspecified type: Secondary | ICD-10-CM | POA: Diagnosis not present

## 2019-12-31 MED ORDER — LISDEXAMFETAMINE DIMESYLATE 30 MG PO CAPS: 30.0000 mg | ORAL_CAPSULE | Freq: Every day | ORAL | 0 refills | Status: DC
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
## 2020-03-30 ENCOUNTER — Ambulatory Visit: Payer: 59 | Admitting: Adult Health

## 2020-06-25 ENCOUNTER — Other Ambulatory Visit: Payer: Self-pay

## 2020-06-25 ENCOUNTER — Ambulatory Visit (INDEPENDENT_AMBULATORY_CARE_PROVIDER_SITE_OTHER): Payer: 59 | Admitting: Adult Health

## 2020-06-25 ENCOUNTER — Encounter: Payer: Self-pay | Admitting: Adult Health

## 2020-06-25 DIAGNOSIS — F909 Attention-deficit hyperactivity disorder, unspecified type: Secondary | ICD-10-CM | POA: Diagnosis not present

## 2020-06-25 MED ORDER — LISDEXAMFETAMINE DIMESYLATE 30 MG PO CAPS: 30.0000 mg | ORAL_CAPSULE | Freq: Every day | ORAL | 0 refills | Status: DC
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
## 2020-09-24 ENCOUNTER — Encounter: Payer: Self-pay | Admitting: Adult Health

## 2020-09-24 ENCOUNTER — Other Ambulatory Visit: Payer: Self-pay

## 2020-09-24 ENCOUNTER — Ambulatory Visit (INDEPENDENT_AMBULATORY_CARE_PROVIDER_SITE_OTHER): Payer: 59 | Admitting: Adult Health

## 2020-09-24 DIAGNOSIS — F909 Attention-deficit hyperactivity disorder, unspecified type: Secondary | ICD-10-CM

## 2020-09-24 MED ORDER — LISDEXAMFETAMINE DIMESYLATE 30 MG PO CAPS: 30.0000 mg | ORAL_CAPSULE | Freq: Every day | ORAL | 0 refills | Status: DC
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
## 2020-12-25 ENCOUNTER — Encounter: Payer: Self-pay | Admitting: Adult Health

## 2020-12-25 ENCOUNTER — Other Ambulatory Visit: Payer: Self-pay

## 2020-12-25 ENCOUNTER — Ambulatory Visit (INDEPENDENT_AMBULATORY_CARE_PROVIDER_SITE_OTHER): Payer: BC Managed Care – PPO | Admitting: Adult Health

## 2020-12-25 DIAGNOSIS — F411 Generalized anxiety disorder: Secondary | ICD-10-CM

## 2020-12-25 DIAGNOSIS — F909 Attention-deficit hyperactivity disorder, unspecified type: Secondary | ICD-10-CM | POA: Diagnosis not present

## 2020-12-25 DIAGNOSIS — F429 Obsessive-compulsive disorder, unspecified: Secondary | ICD-10-CM

## 2020-12-25 MED ORDER — LISDEXAMFETAMINE DIMESYLATE 30 MG PO CAPS: 30.0000 mg | ORAL_CAPSULE | Freq: Every day | ORAL | 0 refills | Status: DC

## 2020-12-25 MED ORDER — SERTRALINE HCL 50 MG PO TABS: 50.0000 mg | ORAL_TABLET | Freq: Every day | ORAL | 5 refills | Status: DC
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
## 2021-01-19 ENCOUNTER — Other Ambulatory Visit: Payer: Self-pay | Admitting: Adult Health

## 2021-01-19 DIAGNOSIS — F411 Generalized anxiety disorder: Secondary | ICD-10-CM

## 2021-01-19 DIAGNOSIS — F429 Obsessive-compulsive disorder, unspecified: Secondary | ICD-10-CM

## 2021-02-22 ENCOUNTER — Other Ambulatory Visit: Payer: Self-pay

## 2021-02-22 ENCOUNTER — Encounter: Payer: Self-pay | Admitting: Adult Health

## 2021-02-22 ENCOUNTER — Ambulatory Visit (INDEPENDENT_AMBULATORY_CARE_PROVIDER_SITE_OTHER): Payer: BC Managed Care – PPO | Admitting: Adult Health

## 2021-02-22 DIAGNOSIS — F909 Attention-deficit hyperactivity disorder, unspecified type: Secondary | ICD-10-CM

## 2021-02-22 DIAGNOSIS — F429 Obsessive-compulsive disorder, unspecified: Secondary | ICD-10-CM | POA: Diagnosis not present

## 2021-02-22 DIAGNOSIS — F411 Generalized anxiety disorder: Secondary | ICD-10-CM | POA: Diagnosis not present

## 2021-02-22 MED ORDER — HYDROXYZINE HCL 10 MG PO TABS: 10.0000 mg | ORAL_TABLET | Freq: Three times a day (TID) | ORAL | 2 refills | Status: DC | PRN

## 2021-02-22 MED ORDER — SERTRALINE HCL 50 MG PO TABS: 50.0000 mg | ORAL_TABLET | Freq: Every day | ORAL | 5 refills | Status: DC

## 2021-02-22 MED ORDER — LISDEXAMFETAMINE DIMESYLATE 30 MG PO CAPS: 30.0000 mg | ORAL_CAPSULE | Freq: Every day | ORAL | 0 refills | Status: DC
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
## 2021-03-31 ENCOUNTER — Other Ambulatory Visit: Payer: Self-pay | Admitting: Adult Health

## 2021-03-31 DIAGNOSIS — F411 Generalized anxiety disorder: Secondary | ICD-10-CM

## 2021-05-05 ENCOUNTER — Other Ambulatory Visit: Payer: Self-pay | Admitting: Adult Health

## 2021-05-05 DIAGNOSIS — F411 Generalized anxiety disorder: Secondary | ICD-10-CM

## 2021-05-25 ENCOUNTER — Other Ambulatory Visit: Payer: Self-pay

## 2021-05-25 ENCOUNTER — Ambulatory Visit (INDEPENDENT_AMBULATORY_CARE_PROVIDER_SITE_OTHER): Payer: BC Managed Care – PPO | Admitting: Adult Health

## 2021-05-25 ENCOUNTER — Encounter: Payer: Self-pay | Admitting: Adult Health

## 2021-05-25 DIAGNOSIS — F4010 Social phobia, unspecified: Secondary | ICD-10-CM

## 2021-05-25 DIAGNOSIS — F422 Mixed obsessional thoughts and acts: Secondary | ICD-10-CM | POA: Diagnosis not present

## 2021-05-25 DIAGNOSIS — F411 Generalized anxiety disorder: Secondary | ICD-10-CM

## 2021-05-25 DIAGNOSIS — F909 Attention-deficit hyperactivity disorder, unspecified type: Secondary | ICD-10-CM | POA: Diagnosis not present

## 2021-05-25 MED ORDER — LISDEXAMFETAMINE DIMESYLATE 30 MG PO CAPS: 30.0000 mg | ORAL_CAPSULE | Freq: Every day | ORAL | 0 refills | Status: DC

## 2021-05-25 MED ORDER — FLUOXETINE HCL 10 MG PO CAPS: 10.0000 mg | ORAL_CAPSULE | Freq: Every day | ORAL | 2 refills | Status: DC
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
## 2021-05-29 ENCOUNTER — Other Ambulatory Visit: Payer: Self-pay | Admitting: Adult Health

## 2021-05-29 DIAGNOSIS — F411 Generalized anxiety disorder: Secondary | ICD-10-CM

## 2021-06-08 ENCOUNTER — Other Ambulatory Visit: Payer: Self-pay | Admitting: Adult Health

## 2021-06-08 DIAGNOSIS — F411 Generalized anxiety disorder: Secondary | ICD-10-CM

## 2021-06-08 DIAGNOSIS — F4010 Social phobia, unspecified: Secondary | ICD-10-CM

## 2021-06-08 DIAGNOSIS — F422 Mixed obsessional thoughts and acts: Secondary | ICD-10-CM

## 2021-06-22 ENCOUNTER — Ambulatory Visit: Payer: BC Managed Care – PPO | Admitting: Adult Health

## 2021-07-28 ENCOUNTER — Ambulatory Visit
Admit: 2021-07-28 | Discharge: 2021-07-28 | Payer: PRIVATE HEALTH INSURANCE | Attending: Family Medicine | Primary: Diagnostic Radiology

## 2021-07-28 DIAGNOSIS — T7840XA Allergy, unspecified, initial encounter: Secondary | ICD-10-CM

## 2021-07-28 MED ORDER — METHYLPREDNISOLONE 4 MG PO TBPK
4 MG | PACK | ORAL | 0 refills | Status: AC
Start: 2021-07-28 — End: 2021-08-09

## 2021-07-28 NOTE — Progress Notes (Signed)
Nicole Kidd is a 25 y.o. female presents today with   Chief Complaint   Patient presents with    Rash     RASH-SPREADING SINCE YESTERDAY STARTED ON STOMACH-NOT REALLY ITCHY    .    HPI: Presents with a rash that started yesterday.  The rash started around her waist and has progressed to her trunk, arms and legs.  She denies previous history of similar symptoms.  She has not had any recent upper respiratory symptoms.  She recently traveled to [place] in July.  When she came back she was seen for a strep throat infection and started on amoxicillin.  She finished that course of antibiotic within the last 2 weeks.  She denies difficulty swallowing or shortness of breath.  She recently started Minipress.  Benadryl last night and this morning which has helped her presentation some.    Current Outpatient Medications   Medication Sig Dispense Refill    prazosin (MINIPRESS) 1 MG capsule Take 1 mg by mouth nightly      diphenhydrAMINE (BENADRYL) 25 MG capsule Take 25 mg by mouth every 6 hours as needed for Itching      methylPREDNISolone (MEDROL DOSEPACK) 4 MG tablet Take 1 tablet by mouth See Admin Instructions for 6 days, THEN 1 tablet See Admin Instructions for 6 days. Take by mouth. Taper as directed on box.  1 DOSE PACK. 1 kit 0     No current facility-administered medications for this visit.        Allergies   Allergen Reactions    Pea Shortness Of Breath    Soy Shortness Of Breath    Acetaminophen Nausea Only     Shaky feeling    Dog Epithelium Allergy Skin Test Other (See Comments)     sinus    Guaifenesin Other (See Comments)     Nausea, shaky        Past Medical History:   Diagnosis Date    Asthma         History reviewed. No pertinent surgical history.     Social History     Socioeconomic History    Marital status: Single     Spouse name: Not on file    Number of children: Not on file    Years of education: Not on file    Highest education level: Not on file   Occupational History    Not on file   Tobacco Use     Smoking status: Never    Smokeless tobacco: Never   Vaping Use    Vaping Use: Never used   Substance and Sexual Activity    Alcohol use: Yes     Alcohol/week: 5.0 standard drinks     Types: 1 Glasses of wine, 4 Drinks containing 0.5 oz of alcohol per week    Drug use: Never    Sexual activity: Not on file   Other Topics Concern    Not on file   Social History Narrative    Not on file     Social Determinants of Health     Financial Resource Strain: Not on file   Food Insecurity: Not on file   Transportation Needs: Not on file   Physical Activity: Not on file   Stress: Not on file   Social Connections: Not on file   Intimate Partner Violence: Not on file   Housing Stability: Not on file        Review of Systems   Constitutional: Negative.  Respiratory: Negative.     Cardiovascular: Negative.    Skin:  Positive for rash.      BP 120/70    Pulse 85    Temp 98.4 ??F (36.9 ??C) (Oral)    Resp 16    Ht 5' (1.524 m)    Wt 110 lb (49.9 kg)    LMP 07/13/2021    SpO2 98%    BMI 21.48 kg/m??      Physical Exam  Constitutional:       Appearance: Normal appearance. She is normal weight.   HENT:      Mouth/Throat:      Pharynx: Oropharynx is clear. No oropharyngeal exudate or posterior oropharyngeal erythema.   Cardiovascular:      Rate and Rhythm: Normal rate and regular rhythm.      Pulses: Normal pulses.      Heart sounds: Normal heart sounds.   Pulmonary:      Effort: Pulmonary effort is normal.      Breath sounds: Normal breath sounds.   Skin:     General: Skin is warm and dry.      Findings: Rash (Pt has a diffuse, flat confluent erythematous rash on trunk, arms and legs.) present.   Neurological:      General: No focal deficit present.      Mental Status: She is alert and oriented to person, place, and time.   Psychiatric:         Mood and Affect: Mood normal.        Nicole Kidd was seen today for rash.    Diagnoses and all orders for this visit:    Allergic reaction to drug, initial encounter    Other orders  -      methylPREDNISolone (MEDROL DOSEPACK) 4 MG tablet; Take 1 tablet by mouth See Admin Instructions for 6 days, THEN 1 tablet See Admin Instructions for 6 days. Take by mouth. Taper as directed on box.  1 DOSE PACK.           ICD-10-CM    1. Allergic reaction to drug, initial encounter  T78.40XA          Presenting with a drug reaction.  She was recently on amoxicillin and I feel this is the causative agent however she recently started Minipress.  Patient will continue Benadryl at bedtime.  I started her and a Medrol Dosepak.  Patient to continue Minipress.  If her rash returns after stopping the steroid then she should discontinue the Minipress and follow-up with her primary care doctor for recheck.    Mervin Kung, DO

## 2021-09-07 DIAGNOSIS — R918 Other nonspecific abnormal finding of lung field: Secondary | ICD-10-CM | POA: Diagnosis not present

## 2021-09-07 DIAGNOSIS — R0602 Shortness of breath: Secondary | ICD-10-CM | POA: Diagnosis not present

## 2021-09-07 DIAGNOSIS — J189 Pneumonia, unspecified organism: Secondary | ICD-10-CM | POA: Diagnosis not present

## 2021-09-08 ENCOUNTER — Telehealth: Payer: Self-pay | Admitting: Adult Health

## 2021-09-08 ENCOUNTER — Other Ambulatory Visit: Payer: Self-pay

## 2021-09-08 DIAGNOSIS — F909 Attention-deficit hyperactivity disorder, unspecified type: Secondary | ICD-10-CM

## 2021-09-08 NOTE — Telephone Encounter (Signed)
Pls refill Vyanse  [id number] - [address]  [address], [place] [address]  Phone:  [phone]  Fax:  [phone]   No upcoming visit, last saw Almira Coaster in June 2022

## 2021-09-08 NOTE — Telephone Encounter (Signed)
Pt has an appt scheduled on 10/12

## 2021-09-08 NOTE — Telephone Encounter (Signed)
Pended.

## 2021-09-08 NOTE — Telephone Encounter (Signed)
Please schedule appt

## 2021-09-09 MED ORDER — LISDEXAMFETAMINE DIMESYLATE 30 MG PO CAPS: 30.0000 mg | ORAL_CAPSULE | Freq: Every day | ORAL | 0 refills | Status: DC

## 2021-09-29 ENCOUNTER — Ambulatory Visit: Payer: BC Managed Care – PPO | Admitting: Adult Health

## 2021-09-29 NOTE — Progress Notes (Signed)
Patient no show appointment. ? ?

## 2021-09-30 ENCOUNTER — Telehealth: Payer: Self-pay | Admitting: Adult Health

## 2021-09-30 ENCOUNTER — Other Ambulatory Visit: Payer: Self-pay

## 2021-09-30 DIAGNOSIS — F909 Attention-deficit hyperactivity disorder, unspecified type: Secondary | ICD-10-CM

## 2021-09-30 NOTE — Telephone Encounter (Signed)
Okay to pend? Had apt scheduled yesterday? Is scheduled next week

## 2021-09-30 NOTE — Telephone Encounter (Signed)
She missed appointment yesterday. Ok to pend.

## 2021-09-30 NOTE — Telephone Encounter (Signed)
Pt would like refill on Vyvanse to [facility] [address]  Next appt 10/17

## 2021-09-30 NOTE — Telephone Encounter (Signed)
Actually looking at [facility] her last refill is 9/22 and she has apt on 10/17 Monday. Refill not due till after her apt

## 2021-10-01 MED ORDER — LISDEXAMFETAMINE DIMESYLATE 30 MG PO CAPS: 30.0000 mg | ORAL_CAPSULE | Freq: Every day | ORAL | 0 refills | Status: DC

## 2021-10-04 ENCOUNTER — Encounter: Payer: Self-pay | Admitting: Adult Health

## 2021-10-04 ENCOUNTER — Ambulatory Visit: Payer: BC Managed Care – PPO | Admitting: Adult Health

## 2021-10-04 NOTE — Progress Notes (Signed)
Patient no show appointment. ? ?

## 2021-10-13 DIAGNOSIS — R051 Acute cough: Secondary | ICD-10-CM | POA: Diagnosis not present

## 2021-11-19 ENCOUNTER — Telehealth: Payer: Self-pay | Admitting: Adult Health

## 2021-11-19 NOTE — Telephone Encounter (Signed)
Need an appointment date set and we can send enough in until appointment.

## 2021-11-19 NOTE — Telephone Encounter (Signed)
Addenum I advised patient that she would need to make an appt as she hasn't been seen since June. She canceled one appt and no showed twice. She stated she has finals and can't come in.

## 2021-11-19 NOTE — Telephone Encounter (Signed)
Patient called in for refill on Vyvanse 30mg . [facility] [address] Ph:[phone]

## 2021-11-19 NOTE — Telephone Encounter (Signed)
Ok to pend with no appt?

## 2021-11-19 NOTE — Telephone Encounter (Signed)
Please let her know she must have an appt set and see if a virtual visit is an option for her

## 2021-11-24 NOTE — Telephone Encounter (Signed)
Ok to pend.

## 2021-11-24 NOTE — Telephone Encounter (Signed)
She made appt for Dec 9th at 8:20am. She asks that med be called in though as she is out. [facility]

## 2021-11-25 ENCOUNTER — Other Ambulatory Visit: Payer: Self-pay

## 2021-11-25 DIAGNOSIS — F909 Attention-deficit hyperactivity disorder, unspecified type: Secondary | ICD-10-CM

## 2021-11-25 MED ORDER — LISDEXAMFETAMINE DIMESYLATE 30 MG PO CAPS: 30.0000 mg | ORAL_CAPSULE | Freq: Every day | ORAL | 0 refills | Status: DC

## 2021-11-25 NOTE — Telephone Encounter (Signed)
Pended.

## 2021-11-26 ENCOUNTER — Ambulatory Visit: Payer: BC Managed Care – PPO | Admitting: Adult Health

## 2021-11-29 ENCOUNTER — Ambulatory Visit: Payer: BC Managed Care – PPO | Admitting: Adult Health

## 2021-11-29 NOTE — Progress Notes (Signed)
Patient no show appointment. ? ?

## 2021-12-19 NOTE — L&D Delivery Note (Signed)
OB/GYN Faculty Practice Delivery Note  Catherine Tucker is a 26 y.o. G2P1001 s/p SVD at [redacted]w[redacted]d. She was admitted for SOL.   ROM: 0h 19m with clear fluid GBS Status: Negative Maximum Maternal Temperature: pt refused  Labor Progress: Pt arrived to MAU in transitional labor at 8cm and was rapidly transferred to L&D. Dr. Richardson Dopp (on call for [facility] was in the OR)  Delivery Date/Time: 08/20/22 at 1835 Delivery: Called to room to standby for Dr. Richardson Dopp but pt began complaining of the need to push and had a bulging bag at the perineum, then SROM'd clear fluid and began pushing on hands and knees. Head delivered ROA. No nuchal cord present. Shoulder and body delivered in usual fashion. Infant with spontaneous cry, placed on mother's abdomen, dried and stimulated. Pt repositioned to semi-Fowlers. Cord clamped x 2 after 1-minute delay, and cut by FOB. Cord blood drawn. Placenta delivered spontaneously, intact, with 3-vessel cord. Fundus boggy and would firm with massage, then return to boggy. Pitocin given IM, fundus remained boggy with clots and moderate bleeding during fundal rubs. Quick inspection of perineum revealed sheer tear on right labia, pt unable to tolerate further exploration. Methergine IM ordered. Pt without IV due to precipitous birth and inability to tolerate IV stick.  Instructed RNs to start IV, Pitocin infusion and TXA as well as fentanyl for pt pain relief. Fundus now firm, pt able to tolerate repair. Vicryl and lidocaine on table, then [facility], CNM to bedside and care turned over to her.   Placenta: Spontaneous, intact, to L&D for disposal Complications: uterine atony Lacerations: right labial sheer EBL: very approximate estimate of before I exited (pt kicked drape off bed) Analgesia: Nitrous oxide  Infant: Girl  APGARs 8/9  pending  Edd Arbour, CNM, IBCLC Certified Nurse Midwife, [facility] for [employer], [facility] 08/20/2022, 7:59  PM

## 2021-12-19 NOTE — L&D Delivery Note (Signed)
Delivery Note    Patient Name: Catherine Tucker DOB: October 17, 1996 MRN: [id number]  Date of admission: 08/20/2022 Delivering MD: Edd Arbour R  Date of delivery: 08/20/2022 Type of delivery: SVD  Newborn Data: Live born female  Birth Weight:   APGAR: 8, 9  Newborn Delivery   Birth date/time: 08/20/2022 18:35:00 Delivery type: Vaginal, Spontaneous        Alford Highland, 26 y.o., @ [redacted]w[redacted]d,  G2P1001, who was admitted for precipitous delivery. I arrived at 1905, placenta had been delivered as well as NB with faculty, please see their note for delivery summary and thank you for the assistance, When I arrived pt had natural delivery EBL was 405, pt continued to trickle after being given TXA, methergine and IM with with IV pitocin in fusing, a right labial laceration was noted and repair with 3.0 vicryl with lidocaine for anesthetic, uterus was firm at U at this point, after the repair vaginal trickle was still noted with boggy uterus, IO cath performed with betadine received, manual placenta retrieve was performed with small retained placenta fragments were obtained, Cytotec given rectally, pt was given fentanyl for pain control and tolerated well, 3gm Unasyn hung for prophylactics x1 dose. When I left the room pt was hemodynamically stable skin to skin with baby.    Maternal Info: Anesthesia:Epidural Lacerations:  right labial Suture Repair: 3.0 vircyl CT-1 Est. Blood Loss (mL):   Newborn Info:  Baby Sex: female  APGAR (1 MIN): 8   APGAR (5 MINS): 9   APGAR (10 MINS):     Mom to postpartum.  Baby to Couplet care / Skin to Skin.   [facility] CNM, FNP-C, PMHNP-BC  [address]  [place], [place] [address]  Cell: [phone]  Office Phone: [phone] Fax: [phone] 08/20/2022  7:53 PM

## 2022-02-01 DIAGNOSIS — Z3A00 Weeks of gestation of pregnancy not specified: Secondary | ICD-10-CM | POA: Diagnosis not present

## 2022-02-01 DIAGNOSIS — R109 Unspecified abdominal pain: Secondary | ICD-10-CM | POA: Diagnosis not present

## 2022-02-01 DIAGNOSIS — O209 Hemorrhage in early pregnancy, unspecified: Secondary | ICD-10-CM | POA: Diagnosis not present

## 2022-02-01 DIAGNOSIS — Z3201 Encounter for pregnancy test, result positive: Secondary | ICD-10-CM | POA: Diagnosis not present

## 2022-02-01 DIAGNOSIS — O99891 Other specified diseases and conditions complicating pregnancy: Secondary | ICD-10-CM | POA: Diagnosis not present

## 2022-02-01 DIAGNOSIS — O99611 Diseases of the digestive system complicating pregnancy, first trimester: Secondary | ICD-10-CM | POA: Diagnosis not present

## 2022-02-01 DIAGNOSIS — O218 Other vomiting complicating pregnancy: Secondary | ICD-10-CM | POA: Diagnosis not present

## 2022-02-09 ENCOUNTER — Emergency Department (HOSPITAL_COMMUNITY): Payer: BC Managed Care – PPO

## 2022-02-09 ENCOUNTER — Other Ambulatory Visit: Payer: Self-pay

## 2022-02-09 ENCOUNTER — Emergency Department (HOSPITAL_COMMUNITY)
Admission: EM | Admit: 2022-02-09 | Discharge: 2022-02-10 | Disposition: A | Discharge status: Home / Self Care | Payer: BC Managed Care – PPO | Attending: Emergency Medicine | Admitting: Emergency Medicine

## 2022-02-09 DIAGNOSIS — O209 Hemorrhage in early pregnancy, unspecified: Secondary | ICD-10-CM | POA: Diagnosis not present

## 2022-02-09 DIAGNOSIS — O034 Incomplete spontaneous abortion without complication: Secondary | ICD-10-CM | POA: Diagnosis not present

## 2022-02-09 DIAGNOSIS — N9489 Other specified conditions associated with female genital organs and menstrual cycle: Secondary | ICD-10-CM | POA: Insufficient documentation

## 2022-02-09 DIAGNOSIS — R42 Dizziness and giddiness: Secondary | ICD-10-CM | POA: Diagnosis not present

## 2022-02-09 DIAGNOSIS — N939 Abnormal uterine and vaginal bleeding, unspecified: Secondary | ICD-10-CM

## 2022-02-09 DIAGNOSIS — Z3A12 12 weeks gestation of pregnancy: Secondary | ICD-10-CM | POA: Diagnosis not present

## 2022-02-09 DIAGNOSIS — R000 Tachycardia, unspecified: Secondary | ICD-10-CM | POA: Insufficient documentation

## 2022-02-09 DIAGNOSIS — O4690 Antepartum hemorrhage, unspecified, unspecified trimester: Secondary | ICD-10-CM

## 2022-02-09 DIAGNOSIS — W19XXXA Unspecified fall, initial encounter: Secondary | ICD-10-CM | POA: Diagnosis not present

## 2022-02-09 DIAGNOSIS — Y93K1 Activity, walking an animal: Secondary | ICD-10-CM | POA: Diagnosis not present

## 2022-02-09 DIAGNOSIS — R55 Syncope and collapse: Secondary | ICD-10-CM | POA: Diagnosis not present

## 2022-02-09 LAB — CBC
HCT: 38.7 % (ref 36.0–46.0)
Hemoglobin: 13.1 g/dL (ref 12.0–15.0)
MCH: 28.4 pg (ref 26.0–34.0)
MCHC: 33.9 g/dL (ref 30.0–36.0)
MCV: 83.9 fL (ref 80.0–100.0)
Platelets: 278 10*3/uL (ref 150–400)
RBC: 4.61 MIL/uL (ref 3.87–5.11)
RDW: 13.3 % (ref 11.5–15.5)
WBC: 12 10*3/uL — ABNORMAL HIGH (ref 4.0–10.5)
nRBC: 0 % (ref 0.0–0.2)

## 2022-02-09 LAB — COMPREHENSIVE METABOLIC PANEL
ALT: 21 U/L (ref 0–44)
AST: 19 U/L (ref 15–41)
Albumin: 3.9 g/dL (ref 3.5–5.0)
Alkaline Phosphatase: 44 U/L (ref 38–126)
Anion gap: 8 (ref 5–15)
BUN: 9 mg/dL (ref 6–20)
CO2: 23 mmol/L (ref 22–32)
Calcium: 9.1 mg/dL (ref 8.9–10.3)
Chloride: 102 mmol/L (ref 98–111)
Creatinine, Ser: 0.62 mg/dL (ref 0.44–1.00)
GFR, Estimated: >60 mL/min (ref >60)
Glucose, Bld: 98 mg/dL (ref 70–99)
Potassium: 3.3 mmol/L — ABNORMAL LOW (ref 3.5–5.1)
Sodium: 133 mmol/L — ABNORMAL LOW (ref 135–145)
Total Bilirubin: 0.4 mg/dL (ref 0.3–1.2)
Total Protein: 7.2 g/dL (ref 6.5–8.1)

## 2022-02-09 LAB — ABO/RH: ABO/RH(D): O POS

## 2022-02-09 LAB — TYPE AND SCREEN
ABO/RH(D): O POS
Antibody Screen: NEGATIVE

## 2022-02-09 LAB — I-STAT BETA HCG BLOOD, ED (MC, WL, AP ONLY): I-stat hCG, quantitative: >2000 m[IU]/mL — ABNORMAL HIGH (ref <5)

## 2022-02-09 LAB — HCG, QUANTITATIVE, PREGNANCY: hCG, Beta Chain, Quant, S: 90505 m[IU]/mL — ABNORMAL HIGH (ref <5)

## 2022-02-09 MED ORDER — LACTATED RINGERS IV BOLUS
1000.0000 mL | Freq: Once | INTRAVENOUS | Status: AC
Start: 2022-02-09 — End: 2022-02-10
  Administered 2022-02-09: 1000 mL via INTRAVENOUS

## 2022-02-09 NOTE — ED Provider Notes (Signed)
[facility]EMERGENCY DEPT Provider Note   CSN: [id number] Arrival date & time: 02/09/22  2150     History  Chief Complaint  Patient presents with   Vaginal Bleeding   Near Syncope    Catherine Tucker is a 26 y.o. female here presenting with vaginal bleeding and near syncope.  Patient states that she was walking her dog and had sudden onset of vaginal bleeding.  She then felt lightheaded and dizzy.  Patient states that she went to urgent care about 10 days ago and had a positive UCG.  Patient states that she started taking prenatal vitamins.  She states that she has follow-up with her OB doctor next week.  She states that she saw [facility] OB for last pregnancy but she is following up with another OB doctor.   The history is provided by the patient.      Home Medications Prior to Admission medications   Medication Sig Start Date End Date Taking? Authorizing Provider  FLUoxetine (PROZAC) 10 MG capsule Take 1 capsule (10 mg total) by mouth daily. 05/25/21   Mozingo, Thereasa Solo, NP  hydrOXYzine (ATARAX/VISTARIL) 10 MG tablet TAKE 1 TABLET BY MOUTH THREE TIMES A DAY AS NEEDED 06/01/21   Mozingo, Thereasa Solo, NP  ibuprofen (ADVIL) 600 MG tablet Take 1 tablet (600 mg total) by mouth every 6 (six) hours. 08/04/19   Levi Aland, MD  lisdexamfetamine (VYVANSE) 30 MG capsule Take 1 capsule (30 mg total) by mouth daily. 09/09/21   Mozingo, Thereasa Solo, NP  lisdexamfetamine (VYVANSE) 30 MG capsule Take 1 capsule (30 mg total) by mouth daily. 10/01/21   Mozingo, Thereasa Solo, NP  lisdexamfetamine (VYVANSE) 30 MG capsule Take 1 capsule (30 mg total) by mouth daily. 11/25/21   Mozingo, Thereasa Solo, NP  Prenatal Vit-Fe Fumarate-FA (PRENATAL MULTIVITAMIN) TABS tablet Take 1 tablet by mouth daily at 12 noon.    [provider]      Allergies    Patient has no known allergies.    Review of Systems   Review of Systems  Genitourinary:  Positive for  vaginal bleeding.  All other systems reviewed and are negative.  Physical Exam Updated Vital Signs BP (!) 116/95 (BP Location: Right Arm)    Pulse (!) 122    Temp 98.2 F (36.8 C) (Oral)    Resp 20    Ht 5\' 2"  (1.575 m)    Wt 56.7 kg    SpO2 99%    BMI 22.86 kg/m  Physical Exam Vitals and nursing note reviewed.  Constitutional:      Appearance: Normal appearance.  HENT:     Head: Normocephalic.     Nose: Nose normal.     Mouth/Throat:     Mouth: Mucous membranes are moist.  Eyes:     Extraocular Movements: Extraocular movements intact.     Pupils: Pupils are equal, round, and reactive to light.  Cardiovascular:     Rate and Rhythm: Regular rhythm. Tachycardia present.     Pulses: Normal pulses.  Pulmonary:     Effort: Pulmonary effort is normal.     Breath sounds: Normal breath sounds.  Abdominal:     General: Abdomen is flat.     Palpations: Abdomen is soft.     Comments: Lower uterine tenderness   Genitourinary:    Comments: Os is open and there is amniotic sac visible by the os.  There is minimal bleeding currently.  Musculoskeletal:     Cervical  back: Normal range of motion and neck supple.  Skin:    General: Skin is warm.  Neurological:     General: No focal deficit present.     Mental Status: She is alert and oriented to person, place, and time.  Psychiatric:        Mood and Affect: Mood normal.        Behavior: Behavior normal.    ED Results / Procedures / Treatments   Labs (all labs ordered are listed, but only abnormal results are displayed) Labs Reviewed  CBC - Abnormal; Notable for the following components:      Result Value   WBC 12.0 (*)    All other components within normal limits  I-STAT BETA HCG BLOOD, ED ([facility], [facility], [facility] ONLY) - Abnormal; Notable for the following components:   I-stat hCG, quantitative >2,000.0 (*)    All other components within normal limits  COMPREHENSIVE METABOLIC PANEL  HCG, QUANTITATIVE, PREGNANCY  TYPE AND SCREEN  ABO/RH     EKG None  Radiology No results found.  Procedures Procedures    EMERGENCY DEPARTMENT [place] PREGNANCY "Study: Limited Ultrasound of the Pelvis for Pregnancy"  INDICATIONS:Pregnancy(required) Multiple views of the uterus and pelvic cavity were obtained in real-time with a multi-frequency probe.  APPROACH:Transabdominal  PERFORMED BY: Myself IMAGES ARCHIVED?: Yes LIMITATIONS: none PREGNANCY FREE FLUID: None ADNEXAL FINDINGS:Left ovary not seen and Right ovary not seen GESTATIONAL AGE, ESTIMATE: 12 week 5 days  FETAL HEART RATE: 160 INTERPRETATION: Intrauterine gestational sac noted, Yolk sac noted, and Fetal pole present     Medications Ordered in ED Medications  lactated ringers bolus 1,000 mL (1,000 mLs Intravenous New Bag/Given 02/09/22 2240)    ED Course/ Medical Decision Making/ A&P                           Medical Decision Making Catherine Tucker is a 26 y.o. female here presenting with vaginal bleeding.  Patient is about [redacted] weeks pregnant.  Her os is open.  Bedside ultrasound confirmed 13-week pregnancy.  I discussed with Dr. Macon Large regarding options.  She states that this may be a incomplete abortion and patient may complete the process. She recommend pelvic rest.   11:34 PM Hemoglobin is stable.  Radiology ultrasound showed 12-week baby.  I told her that she should have pelvic rest and she would not qualify for cerclage right now.  She states that she has follow-up with OB doctor next week.  If she has further bleeding, I recommend that she goes to the MAU.   Problems Addressed: Incomplete abortion: acute illness or injury Vaginal bleeding: acute illness or injury  Amount and/or Complexity of Data Reviewed Labs: ordered. Decision-making details documented in ED Course. Radiology: ordered and independent interpretation performed. Decision-making details documented in ED Course.   Final Clinical Impression(s) / ED Diagnoses Final diagnoses:  None    Rx  / DC Orders ED Discharge Orders     None         Charlynne Pander, MD 02/09/22 2336

## 2022-02-09 NOTE — Discharge Instructions (Signed)
You may have a incomplete miscarriage right now.   I recommend pelvic rest.   You may have more bleeding.   Please see your OB for follow up   I recommend that you go to [facility] hospital if you have uncontrolled bleeding, severe pelvic pain, fever, vomiting

## 2022-02-09 NOTE — ED Triage Notes (Signed)
Pt report she fainted and fell in the grass while walking on her dog. Then she had bleeding in her vagina when she went to the bathroom. Pt report 10-[redacted] weeks pregnant. Pt denies any injury. Pt denies N/V.

## 2022-02-13 ENCOUNTER — Inpatient Hospital Stay (HOSPITAL_COMMUNITY)
Admission: AD | Admit: 2022-02-13 | Discharge: 2022-02-13 | Disposition: A | Discharge status: Home / Self Care | Payer: BC Managed Care – PPO | Attending: Obstetrics & Gynecology | Admitting: Obstetrics & Gynecology

## 2022-02-13 ENCOUNTER — Other Ambulatory Visit: Payer: Self-pay

## 2022-02-13 ENCOUNTER — Encounter (HOSPITAL_COMMUNITY): Payer: Self-pay | Admitting: Obstetrics & Gynecology

## 2022-02-13 DIAGNOSIS — B9689 Other specified bacterial agents as the cause of diseases classified elsewhere: Secondary | ICD-10-CM | POA: Diagnosis not present

## 2022-02-13 DIAGNOSIS — O26891 Other specified pregnancy related conditions, first trimester: Secondary | ICD-10-CM | POA: Diagnosis present

## 2022-02-13 DIAGNOSIS — R109 Unspecified abdominal pain: Secondary | ICD-10-CM

## 2022-02-13 DIAGNOSIS — O3431 Maternal care for cervical incompetence, first trimester: Secondary | ICD-10-CM | POA: Diagnosis not present

## 2022-02-13 DIAGNOSIS — O26851 Spotting complicating pregnancy, first trimester: Secondary | ICD-10-CM

## 2022-02-13 DIAGNOSIS — O26899 Other specified pregnancy related conditions, unspecified trimester: Secondary | ICD-10-CM | POA: Diagnosis not present

## 2022-02-13 DIAGNOSIS — O09291 Supervision of pregnancy with other poor reproductive or obstetric history, first trimester: Secondary | ICD-10-CM | POA: Diagnosis not present

## 2022-02-13 DIAGNOSIS — O23591 Infection of other part of genital tract in pregnancy, first trimester: Secondary | ICD-10-CM | POA: Diagnosis not present

## 2022-02-13 DIAGNOSIS — Z3A11 11 weeks gestation of pregnancy: Secondary | ICD-10-CM | POA: Diagnosis not present

## 2022-02-13 HISTORY — DX: Attention-deficit hyperactivity disorder, unspecified type: F90.9

## 2022-02-13 LAB — URINALYSIS, ROUTINE W REFLEX MICROSCOPIC
Bilirubin Urine: NEGATIVE
Glucose, UA: NEGATIVE mg/dL
Hgb urine dipstick: NEGATIVE
Ketones, ur: 5 mg/dL — AB
Nitrite: NEGATIVE
Protein, ur: NEGATIVE mg/dL
Specific Gravity, Urine: 1.031 — ABNORMAL HIGH (ref 1.005–1.030)
pH: 5 (ref 5.0–8.0)

## 2022-02-13 LAB — WET PREP, GENITAL
Sperm: NONE SEEN
Trich, Wet Prep: NONE SEEN
WBC, Wet Prep HPF POC: >=10 — AB (ref <10)
Yeast Wet Prep HPF POC: NONE SEEN

## 2022-02-13 MED ORDER — METRONIDAZOLE 500 MG PO TABS: 500.0000 mg | ORAL_TABLET | Freq: Two times a day (BID) | ORAL | 0 refills | Status: AC

## 2022-02-13 MED ORDER — PROGESTERONE 200 MG PO CAPS: ORAL_CAPSULE | ORAL | 3 refills | Status: AC

## 2022-02-13 NOTE — MAU Note (Signed)
Pt transferred from the ED to MAU with c/o abdominal pain (cramping) that she has had since her last ED visit but has become more intense.  Pt reports that the pain is located in waist line.  Pt reports LMP 11/22/21 however had come spotting on 12/22/2021 that last three days.  Denies vaginal bleeding but was having some spotting earlier today.

## 2022-02-13 NOTE — MAU Note (Signed)
Dr. Macon Large at bedside performing bedside U/S.

## 2022-02-13 NOTE — Discharge Instructions (Signed)
[facility]iders   All Center for [employer] office take [employer] for [employer] at [employer] [facility]             [address], [place], [place] [address] [phone]  Center for [facility]                                                             [address], [address], [place], [place], [address] [phone]  Center for [facility] at [facility] [address], [address], [place], [place], [address] [phone]  Center for [facility] at [facility] [address], [place], [place], [address] [phone]  Center for [facility] at [facility]                                 [address] [place], [place], [place], [address] [phone]  Center for [facility] at [facility]                                    [address], [place], [place], [address] [phone]  Center for [facility] at [facility] [address], [place], [place], [address]                              [facility]  [address], [place], [place], [address] [phone]  [facility]         Phone: [phone]  [facility]      Phone: [phone]   [facility]      Phone: [phone]  [facility]         Phone: [phone]   [facility]-M[facility]    Phone: [phone]  [facility]      Phone: [phone]  [facility]     Phone: [phone]  [facility]        Phone: [phone]  [facility]      Phone: [phone]

## 2022-02-13 NOTE — ED Triage Notes (Signed)
Pt reports being approximately 13/[redacted] weeks pregnant, states she was recently seen in ED and diagnosed with incomplete miscarriage. Reports lower abdominal pain today, denies any vaginal bleeding at this time but is feeling fatigue. G2P1.
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
## 2022-02-13 NOTE — ED Provider Triage Note (Signed)
Emergency Medicine Provider OB Triage Evaluation Note  Catherine Tucker is a 26 y.o. female, G1P0, at around [redacted] weeks gestation who presents to the emergency department with complaints of abdominal cramping and some watery discharge with an odor.  She believes it to be potentially amniotic fluid.  On 2/22 she was in the emergency department for bleeding during pregnancy as well as near syncope.  At that time pregnancy was confirmed however her cervix was somewhat dilated.  She was put on pelvic rest and told to follow-up with OB/GYN.  Per patient she is unable to get in touch with any OB providers and is very anxious.  Review of  Systems  Positive: Abdominal cramping, malodorous discharge Negative: Bleeding  Physical Exam  BP 112/70 (BP Location: Right Arm)    Pulse 90    Temp 98.5 F (36.9 C) (Oral)    Resp 18    SpO2 99%  General: Awake, no distress  HEENT: Atraumatic  Resp: Normal effort  Cardiac: Normal rate Abd: Nondistended, nontender  MSK: Moves all extremities without difficulty Neuro: Speech clear  Medical Decision Making  Pt evaluated for pregnancy concern and is stable for transfer to MAU. Pt is in agreement with plan for transfer.  8:50 PM Discussed with MAU APP, Danielle, who accepts patient in transfer.  Clinical Impression  No diagnosis found.     Rhae Hammock, PA-C 02/13/22 2055

## 2022-02-14 LAB — GC/CHLAMYDIA PROBE AMP (~~LOC~~) NOT AT ARMC
Chlamydia: NEGATIVE
Comment: NEGATIVE
Comment: NORMAL
Neisseria Gonorrhea: NEGATIVE

## 2022-03-03 ENCOUNTER — Encounter: Payer: Self-pay | Admitting: Adult Health

## 2022-03-03 ENCOUNTER — Other Ambulatory Visit: Payer: Self-pay

## 2022-03-03 ENCOUNTER — Ambulatory Visit (INDEPENDENT_AMBULATORY_CARE_PROVIDER_SITE_OTHER): Payer: BC Managed Care – PPO | Admitting: Adult Health

## 2022-03-03 DIAGNOSIS — F422 Mixed obsessional thoughts and acts: Secondary | ICD-10-CM | POA: Diagnosis not present

## 2022-03-03 DIAGNOSIS — F411 Generalized anxiety disorder: Secondary | ICD-10-CM | POA: Diagnosis not present

## 2022-03-03 DIAGNOSIS — F429 Obsessive-compulsive disorder, unspecified: Secondary | ICD-10-CM | POA: Diagnosis not present

## 2022-03-03 DIAGNOSIS — F909 Attention-deficit hyperactivity disorder, unspecified type: Secondary | ICD-10-CM

## 2022-03-03 DIAGNOSIS — F4010 Social phobia, unspecified: Secondary | ICD-10-CM

## 2022-03-03 MED ORDER — LISDEXAMFETAMINE DIMESYLATE 30 MG PO CAPS: 30.0000 mg | ORAL_CAPSULE | Freq: Every day | ORAL | 0 refills | Status: DC

## 2022-03-03 MED ORDER — HYDROXYZINE HCL 10 MG PO TABS: 10.0000 mg | ORAL_TABLET | Freq: Three times a day (TID) | ORAL | 0 refills | Status: AC | PRN
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
## 2022-04-14 ENCOUNTER — Encounter: Payer: Self-pay | Admitting: Adult Health

## 2022-04-14 ENCOUNTER — Other Ambulatory Visit: Payer: Self-pay | Admitting: Adult Health

## 2022-04-14 ENCOUNTER — Telehealth: Payer: Self-pay | Admitting: Adult Health

## 2022-04-14 DIAGNOSIS — F909 Attention-deficit hyperactivity disorder, unspecified type: Secondary | ICD-10-CM

## 2022-04-14 MED ORDER — LISDEXAMFETAMINE DIMESYLATE 30 MG PO CAPS: 30.0000 mg | ORAL_CAPSULE | Freq: Every day | ORAL | 0 refills | Status: DC

## 2022-04-14 NOTE — Telephone Encounter (Signed)
Script sent  

## 2022-04-14 NOTE — Telephone Encounter (Signed)
Pt called at 4:53 pm and said that she needed a refill on her vyvanse 30 mg to [facility] [address] ?

## 2022-05-10 ENCOUNTER — Other Ambulatory Visit: Payer: Self-pay | Admitting: Adult Health

## 2022-05-10 ENCOUNTER — Telehealth: Payer: Self-pay | Admitting: Adult Health

## 2022-05-10 DIAGNOSIS — F909 Attention-deficit hyperactivity disorder, unspecified type: Secondary | ICD-10-CM

## 2022-05-10 MED ORDER — LISDEXAMFETAMINE DIMESYLATE 30 MG PO CAPS: 30.0000 mg | ORAL_CAPSULE | Freq: Every day | ORAL | 0 refills | Status: DC

## 2022-05-10 NOTE — Telephone Encounter (Signed)
Script sent  

## 2022-05-10 NOTE — Telephone Encounter (Signed)
Pt LVM @ 1:10p.  She would like refill of Vyvanse sent to   [id number] [place], [facility] - [address]  [address] [place], [place] [address]  Phone:  [phone]  Fax:  [phone]  DEA #:  [id number]  Next appt 6/14

## 2022-05-17 DIAGNOSIS — Z369 Encounter for antenatal screening, unspecified: Secondary | ICD-10-CM | POA: Diagnosis not present

## 2022-05-17 DIAGNOSIS — Z3686 Encounter for antenatal screening for cervical length: Secondary | ICD-10-CM | POA: Diagnosis not present

## 2022-05-17 DIAGNOSIS — Z3A25 25 weeks gestation of pregnancy: Secondary | ICD-10-CM | POA: Diagnosis not present

## 2022-05-17 DIAGNOSIS — Z113 Encounter for screening for infections with a predominantly sexual mode of transmission: Secondary | ICD-10-CM | POA: Diagnosis not present

## 2022-05-17 DIAGNOSIS — Z363 Encounter for antenatal screening for malformations: Secondary | ICD-10-CM | POA: Diagnosis not present

## 2022-05-17 LAB — OB RESULTS CONSOLE RUBELLA ANTIBODY, IGM: Rubella: NON-IMMUNE/NOT IMMUNE

## 2022-05-17 LAB — OB RESULTS CONSOLE HIV ANTIBODY (ROUTINE TESTING): HIV: NONREACTIVE

## 2022-05-17 LAB — OB RESULTS CONSOLE RPR: RPR: NONREACTIVE

## 2022-05-17 LAB — HEPATITIS C ANTIBODY: HCV Ab: NEGATIVE

## 2022-05-17 LAB — OB RESULTS CONSOLE HEPATITIS B SURFACE ANTIGEN: Hepatitis B Surface Ag: NEGATIVE

## 2022-06-01 ENCOUNTER — Ambulatory Visit (INDEPENDENT_AMBULATORY_CARE_PROVIDER_SITE_OTHER): Payer: Self-pay | Admitting: Adult Health

## 2022-06-01 DIAGNOSIS — Z91199 Patient's noncompliance with other medical treatment and regimen due to unspecified reason: Secondary | ICD-10-CM

## 2022-06-01 NOTE — Progress Notes (Signed)
Patient no show appointment. ? ?

## 2022-06-02 ENCOUNTER — Encounter: Payer: Self-pay | Admitting: Obstetrics & Gynecology

## 2022-06-02 ENCOUNTER — Inpatient Hospital Stay (HOSPITAL_COMMUNITY)
Admission: AD | Admit: 2022-06-02 | Disposition: A | Discharge status: Home / Self Care | Payer: BC Managed Care – PPO | Source: Home / Self Care | Admitting: Obstetrics & Gynecology

## 2022-06-02 DIAGNOSIS — O26879 Cervical shortening, unspecified trimester: Secondary | ICD-10-CM | POA: Diagnosis not present

## 2022-06-02 DIAGNOSIS — O3432 Maternal care for cervical incompetence, second trimester: Secondary | ICD-10-CM | POA: Diagnosis not present

## 2022-06-02 DIAGNOSIS — Z3A28 28 weeks gestation of pregnancy: Secondary | ICD-10-CM | POA: Diagnosis not present

## 2022-06-02 DIAGNOSIS — Z3A27 27 weeks gestation of pregnancy: Secondary | ICD-10-CM | POA: Diagnosis not present

## 2022-06-02 NOTE — Progress Notes (Signed)
Pt was not admitted. Sent home from admitting due to NICU status. Will discharge out of system.

## 2022-06-14 DIAGNOSIS — Z3A29 29 weeks gestation of pregnancy: Secondary | ICD-10-CM | POA: Diagnosis not present

## 2022-06-14 DIAGNOSIS — O26879 Cervical shortening, unspecified trimester: Secondary | ICD-10-CM | POA: Diagnosis not present

## 2022-06-23 DIAGNOSIS — Z3A31 31 weeks gestation of pregnancy: Secondary | ICD-10-CM | POA: Diagnosis not present

## 2022-06-23 DIAGNOSIS — O4290 Premature rupture of membranes, unspecified as to length of time between rupture and onset of labor, unspecified weeks of gestation: Secondary | ICD-10-CM | POA: Diagnosis not present

## 2022-06-28 DIAGNOSIS — O26879 Cervical shortening, unspecified trimester: Secondary | ICD-10-CM | POA: Diagnosis not present

## 2022-06-28 DIAGNOSIS — Z3A31 31 weeks gestation of pregnancy: Secondary | ICD-10-CM | POA: Diagnosis not present

## 2022-06-28 LAB — OB RESULTS CONSOLE RPR: RPR: NONREACTIVE

## 2022-07-04 DIAGNOSIS — Z3A32 32 weeks gestation of pregnancy: Secondary | ICD-10-CM | POA: Diagnosis not present

## 2022-07-04 DIAGNOSIS — O26879 Cervical shortening, unspecified trimester: Secondary | ICD-10-CM | POA: Diagnosis not present

## 2022-07-12 DIAGNOSIS — O26879 Cervical shortening, unspecified trimester: Secondary | ICD-10-CM | POA: Diagnosis not present

## 2022-07-12 DIAGNOSIS — Z3A33 33 weeks gestation of pregnancy: Secondary | ICD-10-CM | POA: Diagnosis not present

## 2022-08-01 LAB — OB RESULTS CONSOLE GBS: GBS: NEGATIVE

## 2022-08-08 LAB — OB RESULTS CONSOLE GC/CHLAMYDIA
Chlamydia: NEGATIVE
Neisseria Gonorrhea: NEGATIVE

## 2022-08-20 ENCOUNTER — Encounter (HOSPITAL_COMMUNITY): Payer: Self-pay | Admitting: Obstetrics and Gynecology

## 2022-08-20 ENCOUNTER — Other Ambulatory Visit: Payer: Self-pay

## 2022-08-20 ENCOUNTER — Inpatient Hospital Stay (HOSPITAL_COMMUNITY)
Admission: AD | Admit: 2022-08-20 | Discharge: 2022-08-22 | DRG: 806 | Disposition: A | Discharge status: Home / Self Care | Payer: BC Managed Care – PPO | Attending: Obstetrics & Gynecology | Admitting: Obstetrics & Gynecology

## 2022-08-20 DIAGNOSIS — O4202 Full-term premature rupture of membranes, onset of labor within 24 hours of rupture: Secondary | ICD-10-CM | POA: Diagnosis not present

## 2022-08-20 DIAGNOSIS — Z23 Encounter for immunization: Secondary | ICD-10-CM

## 2022-08-20 DIAGNOSIS — Z3A38 38 weeks gestation of pregnancy: Secondary | ICD-10-CM | POA: Diagnosis not present

## 2022-08-20 DIAGNOSIS — Z3A39 39 weeks gestation of pregnancy: Secondary | ICD-10-CM

## 2022-08-20 LAB — TYPE AND SCREEN
ABO/RH(D): O POS
Antibody Screen: NEGATIVE

## 2022-08-20 LAB — CBC
HCT: 33.1 % — ABNORMAL LOW (ref 36.0–46.0)
Hemoglobin: 10.5 g/dL — ABNORMAL LOW (ref 12.0–15.0)
MCH: 26.6 pg (ref 26.0–34.0)
MCHC: 31.7 g/dL (ref 30.0–36.0)
MCV: 83.8 fL (ref 80.0–100.0)
Platelets: 263 10*3/uL (ref 150–400)
RBC: 3.95 MIL/uL (ref 3.87–5.11)
RDW: 14.1 % (ref 11.5–15.5)
WBC: 15.6 10*3/uL — ABNORMAL HIGH (ref 4.0–10.5)
nRBC: 0 % (ref 0.0–0.2)

## 2022-08-20 MED ORDER — LIDOCAINE HCL (PF) 1 % IJ SOLN
30.0000 mL | INTRAMUSCULAR | Status: DC | PRN
  Filled 2022-08-20: qty 30

## 2022-08-20 MED ORDER — SODIUM CHLORIDE 0.9 % IV SOLN
3.0000 g | Freq: Once | INTRAVENOUS | Status: AC
  Administered 2022-08-20: 3 g via INTRAVENOUS
  Filled 2022-08-20: qty 8

## 2022-08-20 MED ORDER — LACTATED RINGERS IV SOLN: 500.0000 mL | INTRAVENOUS | Status: DC | PRN

## 2022-08-20 MED ORDER — COCONUT OIL OIL: 1.0000 | TOPICAL_OIL | Status: DC | PRN

## 2022-08-20 MED ORDER — TETANUS-DIPHTH-ACELL PERTUSSIS 5-2.5-18.5 LF-MCG/0.5 IM SUSY
0.5000 mL | PREFILLED_SYRINGE | Freq: Once | INTRAMUSCULAR | Status: AC
  Administered 2022-08-22: 0.5 mL via INTRAMUSCULAR
  Filled 2022-08-20: qty 0.5

## 2022-08-20 MED ORDER — OXYCODONE-ACETAMINOPHEN 5-325 MG PO TABS: 1.0000 | ORAL_TABLET | ORAL | Status: DC | PRN

## 2022-08-20 MED ORDER — ACETAMINOPHEN 325 MG PO TABS: 650.0000 mg | ORAL_TABLET | ORAL | Status: DC | PRN

## 2022-08-20 MED ORDER — ONDANSETRON HCL 4 MG PO TABS: 4.0000 mg | ORAL_TABLET | ORAL | Status: DC | PRN

## 2022-08-20 MED ORDER — FENTANYL CITRATE (PF) 100 MCG/2ML IJ SOLN
INTRAMUSCULAR | Status: AC
  Administered 2022-08-20: 100 ug
  Filled 2022-08-20: qty 2

## 2022-08-20 MED ORDER — OXYTOCIN BOLUS FROM INFUSION: 333.0000 mL | Freq: Once | INTRAVENOUS | Status: DC

## 2022-08-20 MED ORDER — SENNOSIDES-DOCUSATE SODIUM 8.6-50 MG PO TABS
2.0000 | ORAL_TABLET | Freq: Every day | ORAL | Status: DC
  Administered 2022-08-21 – 2022-08-22 (×2): 2 via ORAL
  Filled 2022-08-20 (×2): qty 2

## 2022-08-20 MED ORDER — OXYTOCIN 10 UNIT/ML IJ SOLN
10.0000 [IU] | Freq: Once | INTRAMUSCULAR | Status: AC
  Administered 2022-08-20: 10 [IU] via INTRAMUSCULAR

## 2022-08-20 MED ORDER — TRANEXAMIC ACID-NACL 1000-0.7 MG/100ML-% IV SOLN
INTRAVENOUS | Status: AC
  Filled 2022-08-20: qty 100

## 2022-08-20 MED ORDER — OXYTOCIN-SODIUM CHLORIDE 30-0.9 UT/500ML-% IV SOLN
2.5000 [IU]/h | INTRAVENOUS | Status: DC
  Filled 2022-08-20: qty 500

## 2022-08-20 MED ORDER — FENTANYL CITRATE (PF) 100 MCG/2ML IJ SOLN
INTRAMUSCULAR | Status: AC
  Filled 2022-08-20: qty 2

## 2022-08-20 MED ORDER — MISOPROSTOL 200 MCG PO TABS: 1000.0000 ug | ORAL_TABLET | Freq: Once | ORAL | Status: AC

## 2022-08-20 MED ORDER — WITCH HAZEL-GLYCERIN EX PADS: 1.0000 | MEDICATED_PAD | CUTANEOUS | Status: DC | PRN

## 2022-08-20 MED ORDER — SIMETHICONE 80 MG PO CHEW: 80.0000 mg | CHEWABLE_TABLET | ORAL | Status: DC | PRN

## 2022-08-20 MED ORDER — PRENATAL MULTIVITAMIN CH
1.0000 | ORAL_TABLET | Freq: Every day | ORAL | Status: DC
Start: 2022-08-21 — End: 2022-08-22
  Administered 2022-08-21 – 2022-08-22 (×2): 1 via ORAL
  Filled 2022-08-20 (×2): qty 1

## 2022-08-20 MED ORDER — ZOLPIDEM TARTRATE 5 MG PO TABS: 5.0000 mg | ORAL_TABLET | Freq: Every evening | ORAL | Status: DC | PRN

## 2022-08-20 MED ORDER — FENTANYL CITRATE (PF) 100 MCG/2ML IJ SOLN
100.0000 ug | Freq: Once | INTRAMUSCULAR | Status: AC
  Administered 2022-08-20: 100 ug via INTRAVENOUS

## 2022-08-20 MED ORDER — DIBUCAINE (PERIANAL) 1 % EX OINT: 1.0000 | TOPICAL_OINTMENT | CUTANEOUS | Status: DC | PRN

## 2022-08-20 MED ORDER — SOD CITRATE-CITRIC ACID 500-334 MG/5ML PO SOLN: 30.0000 mL | ORAL | Status: DC | PRN

## 2022-08-20 MED ORDER — LACTATED RINGERS IV SOLN: INTRAVENOUS | Status: DC

## 2022-08-20 MED ORDER — OXYTOCIN 10 UNIT/ML IJ SOLN
INTRAMUSCULAR | Status: AC
  Filled 2022-08-20: qty 1

## 2022-08-20 MED ORDER — IBUPROFEN 600 MG PO TABS
600.0000 mg | ORAL_TABLET | Freq: Four times a day (QID) | ORAL | Status: DC
  Administered 2022-08-20 – 2022-08-22 (×5): 600 mg via ORAL
  Filled 2022-08-20 (×6): qty 1

## 2022-08-20 MED ORDER — OXYCODONE-ACETAMINOPHEN 5-325 MG PO TABS: 2.0000 | ORAL_TABLET | ORAL | Status: DC | PRN

## 2022-08-20 MED ORDER — ONDANSETRON HCL 4 MG/2ML IJ SOLN: 4.0000 mg | Freq: Four times a day (QID) | INTRAMUSCULAR | Status: DC | PRN

## 2022-08-20 MED ORDER — DIPHENHYDRAMINE HCL 25 MG PO CAPS: 25.0000 mg | ORAL_CAPSULE | Freq: Four times a day (QID) | ORAL | Status: DC | PRN

## 2022-08-20 MED ORDER — MISOPROSTOL 200 MCG PO TABS
ORAL_TABLET | ORAL | Status: AC
  Administered 2022-08-20: 1000 ug via RECTAL
  Filled 2022-08-20: qty 5

## 2022-08-20 MED ORDER — TRANEXAMIC ACID-NACL 1000-0.7 MG/100ML-% IV SOLN
1000.0000 mg | INTRAVENOUS | Status: AC
Start: 2022-08-20 — End: 2022-08-20
  Administered 2022-08-20: 1000 mg via INTRAVENOUS

## 2022-08-20 MED ORDER — METHYLERGONOVINE MALEATE 0.2 MG/ML IJ SOLN
INTRAMUSCULAR | Status: AC
  Filled 2022-08-20: qty 1

## 2022-08-20 MED ORDER — BENZOCAINE-MENTHOL 20-0.5 % EX AERO
1.0000 | INHALATION_SPRAY | CUTANEOUS | Status: DC | PRN
  Filled 2022-08-20: qty 56

## 2022-08-20 MED ORDER — ONDANSETRON HCL 4 MG/2ML IJ SOLN: 4.0000 mg | INTRAMUSCULAR | Status: DC | PRN

## 2022-08-20 MED ORDER — METHYLERGONOVINE MALEATE 0.2 MG/ML IJ SOLN
0.2000 mg | Freq: Once | INTRAMUSCULAR | Status: AC
Start: 2022-08-20 — End: 2022-08-20
  Administered 2022-08-20: 0.2 mg via INTRAMUSCULAR

## 2022-08-20 NOTE — Progress Notes (Signed)
ANTIBIOTIC CONSULT NOTE - INITIAL  Pharmacy Consult for Unasyn x 1 dose  Indication:  manual placenta removal  No Known Allergies  Vital Signs: Temp: 98.4 F (36.9 C) (09/02 1942) Temp Source: Oral (09/02 1942) BP: 123/80 (09/02 2014) Pulse Rate: 80 (09/02 2014)  Labs: Recent Labs    08/20/22 1903  WBC 15.6*  HGB 10.5*  PLT 263    Plan:  Unasyn 3gm IV once   Sherrilyn Rist 08/20/2022,8:42 PM

## 2022-08-20 NOTE — Lactation Note (Signed)
This note was copied from a baby's chart. Lactation Consultation Note  Patient Name: Catherine Tucker XMIWO'E Date: 08/20/2022 Reason for consult: Initial assessment;Early term 37-38.6wks Age:25 hours Per Support Person, infant had two eposides of being spitty and Birth Parent has been doing a lot of STS with infant. Infant had 3 stools since birth, Support Person was changing stool while LC in room after infant latched at the breast. Birth Parent latched infant on the left breast using the cradle hold, infant latched with depth and BF for 10 minutes. Birth Parent will continue to BF infant according to hunger cues, on demand, 8 to 12+ times within 24 hours, STS. Birth Parent know if infant is still cuing after latching on the 1st breast to offer the 2nd breast during a feeding. Birth Parent was taught hand expression by [facility] using a breast model and Birth Parent self expressed and infant was given 4 mls of EBM by spoon. Birth Parent knows if infant doesn't latch at breast to ask RN/LC for latch assistance and to hand express and give infant back  her EBM.  Maternal Data Has patient been taught Hand Expression?: Yes Birth Parent BF 1st child for 7 months who is currently 3 years.  Feeding Mother's Current Feeding Choice: Breast Milk  LATCH Score Latch: Grasps breast easily, tongue down, lips flanged, rhythmical sucking.  Audible Swallowing: Spontaneous and intermittent  Type of Nipple: Everted at rest and after stimulation  Comfort (Breast/Nipple): Soft / non-tender  Hold (Positioning): Assistance needed to correctly position infant at breast and maintain latch.  LATCH Score: 9   Lactation Tools Discussed/Used    Interventions Interventions: Breast feeding basics reviewed  Discharge Pump: Personal (Per Birth Parent,  she has Spectra 2 and Elvie DEBP at home.)  Consult Status Consult Status: Follow-up Date: 08/21/22 Follow-up type: In-patient    [name] 08/20/2022,  11:05 PM

## 2022-08-20 NOTE — MAU Note (Signed)
.  Catherine Tucker is a 26 y.o. at [redacted]w[redacted]d here in MAU reporting: back to back contractions Onset of complaint: 08/20/2022 @ 1700 FHT:135    CNM @ BS for eval.
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
## 2022-08-21 LAB — RPR: RPR Ser Ql: NONREACTIVE

## 2022-08-21 LAB — CBC
HCT: 24.9 % — ABNORMAL LOW (ref 36.0–46.0)
Hemoglobin: 8.5 g/dL — ABNORMAL LOW (ref 12.0–15.0)
MCH: 27.4 pg (ref 26.0–34.0)
MCHC: 34.1 g/dL (ref 30.0–36.0)
MCV: 80.3 fL (ref 80.0–100.0)
Platelets: 236 10*3/uL (ref 150–400)
RBC: 3.1 MIL/uL — ABNORMAL LOW (ref 3.87–5.11)
RDW: 14 % (ref 11.5–15.5)
WBC: 17.2 10*3/uL — ABNORMAL HIGH (ref 4.0–10.5)
nRBC: 0 % (ref 0.0–0.2)

## 2022-08-21 MED ORDER — SODIUM CHLORIDE 0.9 % IV SOLN
500.0000 mg | Freq: Once | INTRAVENOUS | Status: AC
  Administered 2022-08-21: 500 mg via INTRAVENOUS
  Filled 2022-08-21: qty 500

## 2022-08-21 MED ORDER — IBUPROFEN 600 MG PO TABS: 600.0000 mg | ORAL_TABLET | Freq: Four times a day (QID) | ORAL | 0 refills | Status: AC | PRN

## 2022-08-21 MED ORDER — ACETAMINOPHEN 325 MG PO TABS: 650.0000 mg | ORAL_TABLET | ORAL | Status: AC | PRN

## 2022-08-21 NOTE — Progress Notes (Signed)
Subjective: Postpartum Day # 1 : S/P NSVD due to pt was admitted on 9/2 for a precipitous delivery, progressed fast to SVD over right labial repaired, ebl was , TXA, cytotec, methergine, and Pit were given, had baby female, stable. PP manual sweep performed, Unasyn x1 dose hung. Patient up ad lib, denies syncope or dizziness. Reports consuming regular diet without issues and denies N/V. Patient reports 0 bowel movement + passing flatus.  Denies issues with urination and reports bleeding is "lighter."  Patient is breastfeeding and reports going well.  Desires depo at 6 weeks PPV for postpartum contraception.  Pain is being appropriately managed with use of po meds.   Right labial laceration Feeding:  breast Contraceptive plan:  depo at 6 weeks Baby Female  Objective: Vital signs in last 24 hours: Patient Vitals for the past 24 hrs:  BP Temp Temp src Pulse Resp SpO2 Height Weight  08/21/22 0513 115/74 98.5 F (36.9 C) Oral 84 18 -- -- --  08/21/22 0320 120/67 98.4 F (36.9 C) Oral 88 18 -- -- --  08/20/22 2318 116/85 98 F (36.7 C) Oral 78 18 -- -- --  08/20/22 2130 111/76 98.7 F (37.1 C) Oral 92 18 98 % -- --  08/20/22 2032 118/80 -- -- 85 -- -- 5\' 2"  (1.575 m) 73.7 kg  08/20/22 2014 123/80 -- -- 80 -- -- -- --  08/20/22 2002 117/83 -- -- 90 -- -- -- --  08/20/22 1950 105/72 -- -- 97 -- -- -- --  08/20/22 1942 119/78 98.4 F (36.9 C) Oral 77 18 -- -- --  08/20/22 1917 125/78 -- -- 93 -- -- -- --  08/20/22 1909 115/63 -- -- 93 -- -- -- --  08/20/22 1906 101/63 -- -- 84 -- -- -- --     Physical Exam:  General: alert, cooperative, and no distress Mood/Affect: happy Lungs: clear to auscultation, no wheezes, rales or rhonchi, symmetric air entry.  Heart: normal rate, regular rhythm, normal S1, S2, no murmurs, rubs, clicks or gallops. Breast: breasts appear normal, no suspicious masses, no skin or nipple changes or axillary nodes. Abdomen:  + bowel sounds, soft,  non-tender GU: perineum approximate, healing well. No signs of external hematomas.  Uterine Fundus: firm Lochia: appropriate Skin: Warm, Dry. DVT Evaluation: No evidence of DVT seen on physical exam. Negative Homan's sign. No cords or calf tenderness. No significant calf/ankle edema.     Latest Ref Rng & Units 08/20/2022    7:03 PM 02/09/2022   10:12 PM 08/03/2019    7:30 AM  CBC  WBC 4.0 - 10.5 K/uL 15.6  12.0  10.4   Hemoglobin 12.0 - 15.0 g/dL 08/05/2019  56.2  13.0   Hematocrit 36.0 - 46.0 % 33.1  38.7  36.5   Platelets 150 - 400 K/uL 263  278  217     Results for orders placed or performed during the hospital encounter of 08/20/22 (from the past 24 hour(s))  Type and screen [facility]     Status: None   Collection Time: 08/20/22  7:02 PM  Result Value Ref Range   ABO/RH(D) O POS    Antibody Screen NEG    Sample Expiration      08/23/2022,2359 Performed at [facility], [address], [place], [place]   CBC     Status: Abnormal   Collection Time: 08/20/22  7:03 PM  Result Value Ref Range   WBC 15.6 (H) 4.0 -  10.5 K/uL   RBC 3.95 3.87 - 5.11 MIL/uL   Hemoglobin 10.5 (L) 12.0 - 15.0 g/dL   HCT 67.6 (L) 72.0 - 94.7 %   MCV 83.8 80.0 - 100.0 fL   MCH 26.6 26.0 - 34.0 pg   MCHC 31.7 30.0 - 36.0 g/dL   RDW 09.6 28.3 - 66.2 %   Platelets 263 150 - 400 K/uL   nRBC 0.0 0.0 - 0.2 %     CBG (last 3)  No results for input(s): "GLUCAP" in the last 72 hours.   I/O last 3 completed shifts: In: -  Out: 459 [Blood:459]   Assessment Postpartum Day # 1 : S/P NSVD due to pt was admitted on 9/2 for a precipitous delivery, progressed fast to SVD over right labial repaired, ebl was , TXA, cytotec, methergine, and Pit were given, had baby female, stable. PP manual sweep performed, unasyn x1 dose hung.  Pt stable. -1 involution. breastfeeding. Hemodynamically stable.   Plan: Continue other mgmt as ordered VTE prophylactics: Early ambulated as  tolerates.  Pain control: Motrin/Tylenol PRN Education given regarding options for contraception, including barrier methods, injectable contraception, IUD placement, oral contraceptives.  Plan for discharge tomorrow, Breastfeeding, and Lactation consult  Pt will get depo at 6 weeks PPV.   Dr. Richardson Dopp to be updated on patient status  [facility] CNM, FNP-C, PMHNP-BC  [address]  [place], [place] [address]  Cell: [phone]  Office Phone: [phone] Fax: [phone] 08/21/2022  5:35 AM

## 2022-08-21 NOTE — Progress Notes (Signed)
MOB was referred for history of depression/anxiety. * Referral screened out by Clinical Social Worker because none of the following criteria appear to apply: ~ History of anxiety/depression during this pregnancy, or of post-partum depression following prior delivery. No concerns noted during pregnancy in PNC records.  ~ Diagnosis of anxiety and/or depression within last 3 years OR * MOB's symptoms currently being treated with medication and/or therapy.  Please contact the Clinical Social Worker if needs arise, by MOB request, or if MOB scores greater than 9/yes to question 10 on Edinburgh Postpartum Depression Screen.  Signed,  Roxy Filler K. [place]ast[place]ia [place], MSW, LCSWA, LCASA 08/21/2022 8:58 AM 

## 2022-08-21 NOTE — Lactation Note (Signed)
This note was copied from a baby's chart. Lactation Consultation Note  Patient Name: Catherine Tucker CVELF'Y Date: 08/21/2022 Reason for consult: Follow-up assessment Age:25 hours  Baby latched with ease just prior to PKU. Mother states baby likes to eat and has been breastfeeding well. Mother knows to call for assistance as needed.  Feeding Mother's Current Feeding Choice: Breast Milk  LATCH Score Latch: Grasps breast easily, tongue down, lips flanged, rhythmical sucking.  Audible Swallowing: A few with stimulation  Type of Nipple: Everted at rest and after stimulation  Comfort (Breast/Nipple): Soft / non-tender  Hold (Positioning): No assistance needed to correctly position infant at breast.  LATCH Score: 9   Interventions Interventions: Education  Discharge    Consult Status Consult Status: Follow-up Date: 08/22/22 Follow-up type: In-patient    Dahlia Byes [facility] 08/21/2022, 6:46 PM

## 2022-08-22 MED ORDER — MEASLES, MUMPS & RUBELLA VAC IJ SOLR
0.5000 mL | Freq: Once | INTRAMUSCULAR | Status: AC
Start: 2022-08-22 — End: 2022-08-22
  Administered 2022-08-22: 0.5 mL via SUBCUTANEOUS

## 2022-08-22 NOTE — Lactation Note (Signed)
This note was copied from a baby's chart. Lactation Consultation Note  Patient Name: Catherine Tucker [id number] Date: 08/22/2022 Reason for consult: Follow-up assessment Age:25 hours  Baby was latched when [name] entered room with intermittent swallows. Reviewed engorgement care and monitoring voids/stools.   Feeding Mother's Current Feeding Choice: Breast Milk  LATCH Score Latch: Grasps breast easily, tongue down, lips flanged, rhythmical sucking.  Audible Swallowing: A few with stimulation  Type of Nipple: Everted at rest and after stimulation  Comfort (Breast/Nipple): Soft / non-tender  Hold (Positioning): No assistance needed to correctly position infant at breast.  LATCH Score: 9   Interventions Interventions: Education  Discharge Discharge Education: Engorgement and breast care;Warning signs for feeding baby Pump: Personal  Consult Status Consult Status: Follow-up Date: 08/23/22 Follow-up type: In-patient    Dahlia Byes [facility] 08/22/2022, 2:29 PM

## 2022-08-22 NOTE — Discharge Summary (Signed)
Postpartum Discharge Summary  Date of Service updated 08/22/2022     Patient Name: Catherine Tucker DOB: December 15, 1996 MRN: [id number]  Date of admission: 08/20/2022 Delivery date:08/20/2022  Delivering provider: Gaylan Gerold R  Date of discharge: 08/22/2022  Admitting diagnosis: Normal labor [O80, Z37.9] Intrauterine pregnancy: [redacted]w[redacted]d    Secondary diagnosis:  Active Problems:   Normal labor   Precipitous delivery   Normal postpartum course  Additional problems: None    Discharge diagnosis: Term Pregnancy Delivered and PDawson                                             Post partum procedures: None Augmentation: N/A Complications: None  Hospital course: Onset of Labor With Vaginal Delivery      26y.o. yo [id number] admitted in Active Labor on 08/20/2022. Patient had an uncomplicated labor course as follows:  Membrane Rupture Time/Date: 6:24 PM ,08/20/2022   Delivery Method:Vaginal, Spontaneous  Episiotomy: None  Lacerations:  Labial  Patient had an uncomplicated postpartum course.  She is ambulating, tolerating a regular diet, passing flatus, and urinating well. Patient is discharged home in stable condition on 08/22/22.  Newborn Data: Birth date:08/20/2022  Birth time:6:35 PM  Gender:Female  Living status:Living  Apgars:8 ,9  Weight:3490 g   Magnesium Sulfate received: No BMZ received: No Rhophylac:N/A MMR:N/A T-DaP: Unknown Flu: N/A Transfusion:No  Physical exam  Vitals:   08/21/22 0835 08/21/22 1316 08/21/22 1900 08/22/22 0500  BP: 121/84 115/73 123/85 127/74  Pulse: 78 90 85 88  Resp: '16 18 18 18  ' Temp: (!) 97.5 F (36.4 C) 97.6 F (36.4 C)    TempSrc: Oral Oral Oral   SpO2: 99% 99% 99% 99%  Weight:      Height:       General: alert, cooperative, and no distress Lochia: appropriate Uterine Fundus: firm Incision: N/A DVT Evaluation: No evidence of DVT seen on physical exam. Labs: Lab Results  Component Value Date   WBC 17.2 (H) 08/21/2022    HGB 8.5 (L) 08/21/2022   HCT 24.9 (L) 08/21/2022   MCV 80.3 08/21/2022   PLT 236 08/21/2022      Latest Ref Rng & Units 02/09/2022   10:12 PM  CMP  Glucose 70 - 99 mg/dL 98   BUN 6 - 20 mg/dL 9   Creatinine 0.44 - 1.00 mg/dL 0.62   Sodium 135 - 145 mmol/L 133   Potassium 3.5 - 5.1 mmol/L 3.3   Chloride 98 - 111 mmol/L 102   CO2 22 - 32 mmol/L 23   Calcium 8.9 - 10.3 mg/dL 9.1   Total Protein 6.5 - 8.1 g/dL 7.2   Total Bilirubin 0.3 - 1.2 mg/dL 0.4   Alkaline Phos 38 - 126 U/L 44   AST 15 - 41 U/L 19   ALT 0 - 44 U/L 21    Edinburgh Score:    08/21/2022    5:11 AM  Edinburgh Postnatal Depression Scale Screening Tool  I have been able to laugh and see the funny side of things. 0  I have looked forward with enjoyment to things. 0  I have blamed myself unnecessarily when things went wrong. 1  I have been anxious or worried for no good reason. 0  I have felt scared or panicky for no good reason. 0  Things have been  getting on top of me. 0  I have been so unhappy that I have had difficulty sleeping. 0  I have felt sad or miserable. 0  I have been so unhappy that I have been crying. 0  The thought of harming myself has occurred to me. 0  Edinburgh Postnatal Depression Scale Total 1      After visit meds:  Allergies as of 08/22/2022   No Known Allergies      Medication List     TAKE these medications    acetaminophen 325 MG tablet Commonly known as: Tylenol Take 2 tablets (650 mg total) by mouth every 4 (four) hours as needed (for pain scale < 4).   hydrOXYzine 10 MG tablet Commonly known as: ATARAX Take 1 tablet (10 mg total) by mouth 3 (three) times daily as needed.   ibuprofen 600 MG tablet Commonly known as: ADVIL Take 1 tablet (600 mg total) by mouth every 6 (six) hours as needed.   lisdexamfetamine 30 MG capsule Commonly known as: Vyvanse Take 1 capsule (30 mg total) by mouth daily.   prenatal multivitamin Tabs tablet Take 1 tablet by mouth daily at 12  noon.   progesterone 200 MG capsule Commonly known as: PROMETRIUM Place one capsule vaginally at bedtime         Discharge home in stable condition Infant Feeding: Breast Infant Disposition:home with mother Discharge instruction: per After Visit Summary and Postpartum booklet. Activity: Advance as tolerated. Pelvic rest for 6 weeks.  Diet: routine diet Anticipated Birth Control: Unsure Postpartum Appointment:6 weeks Additional Postpartum F/U: Postpartum Depression checkup Future Appointments:No future appointments. Follow up Visit:  Follow-up Information     Waymon Amato, MD. Schedule an appointment as soon as possible for a visit in 6 week(s).   Specialty: Obstetrics and Gynecology Contact information: [address] [place] [address] [phone]                     08/22/2022 Christophe Louis, MD

## 2022-09-01 ENCOUNTER — Telehealth (HOSPITAL_COMMUNITY): Payer: Self-pay | Admitting: *Deleted

## 2022-09-01 NOTE — Telephone Encounter (Signed)
Left phone voicemail message.  Duffy Rhody, RN 09-01-2022 at 9:12am

## 2022-09-06 IMAGING — US US OB COMP LESS 14 WK
1 series · 15 of 28 positions shown · non-contrast
Comparison: None.

CLINICAL DATA: Thirteen weeks pregnant, vaginal bleeding.
Gestational age by last menstrual. Of 11 weeks and 2 days. Estimated
due date by last menstrual period 08/29/2022. Last menstrual period
11/22/2021

EXAM:
OBSTETRIC <14 WK ULTRASOUND
TECHNIQUE: Transabdominal ultrasound was performed for evaluation of the
gestation as well as the maternal uterus and adnexal regions.

[Series 1: us ob transvaginal mc & wl · 15 of 29 slices shown]
[im 1/29]
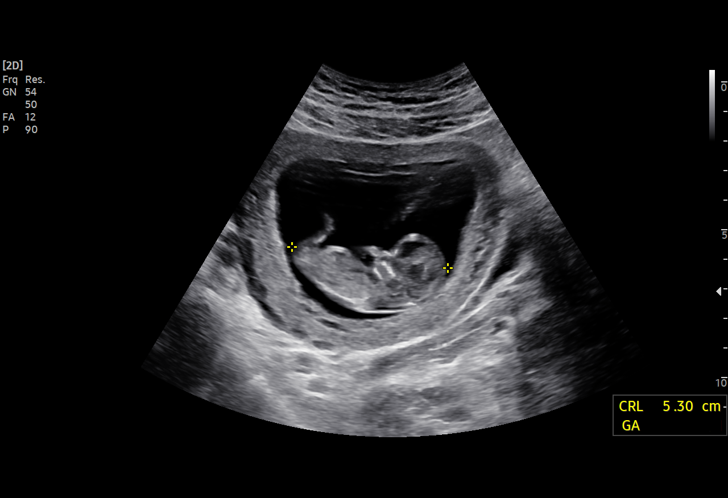
[im 3/29]
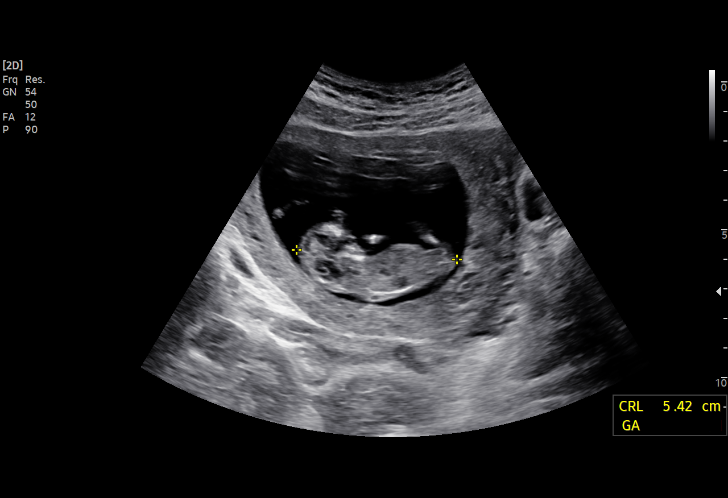
[im 5/29]
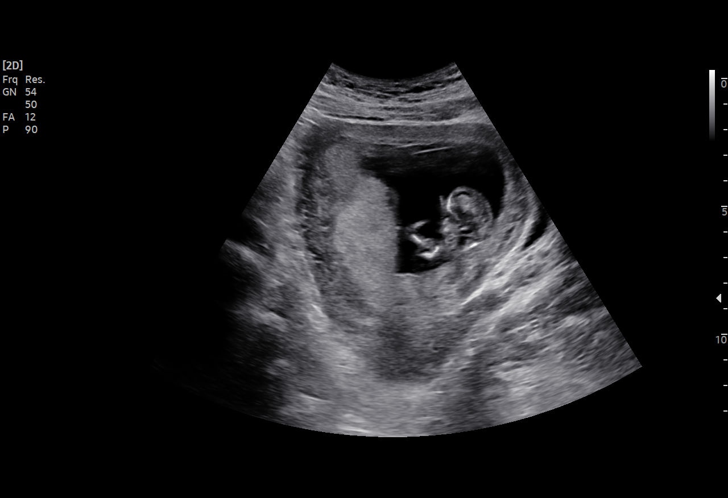
[im 7/29]
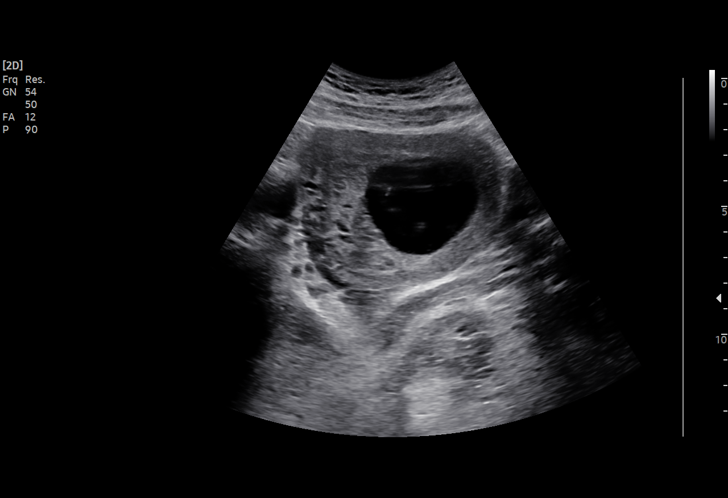
[im 9/29]
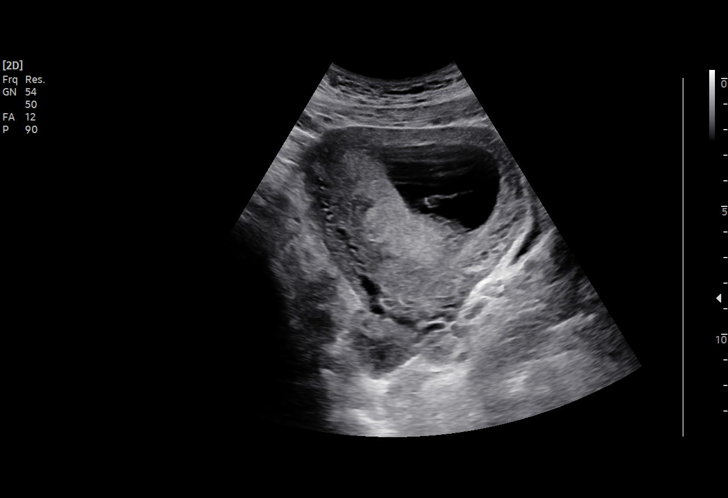
[im 11/29]
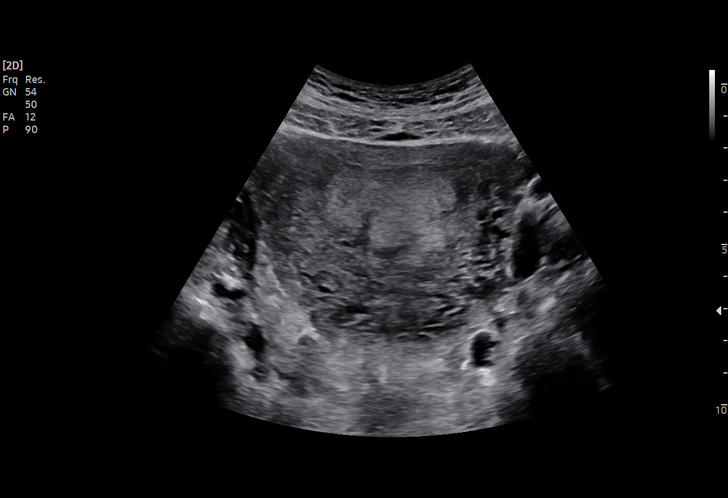
[im 13/29]
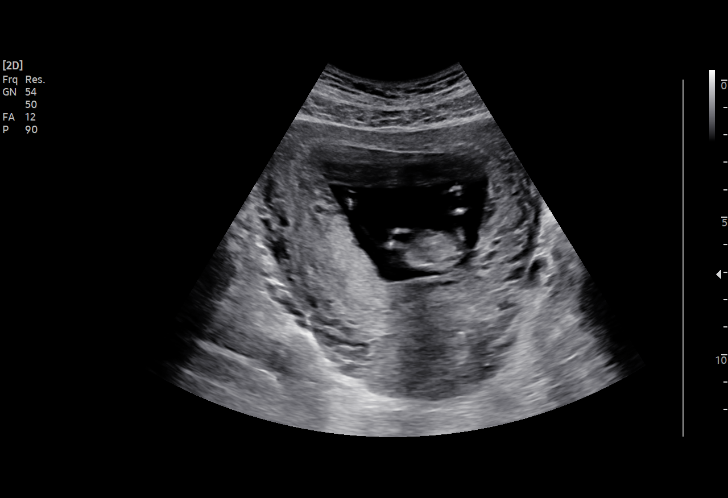
[im 15/29]
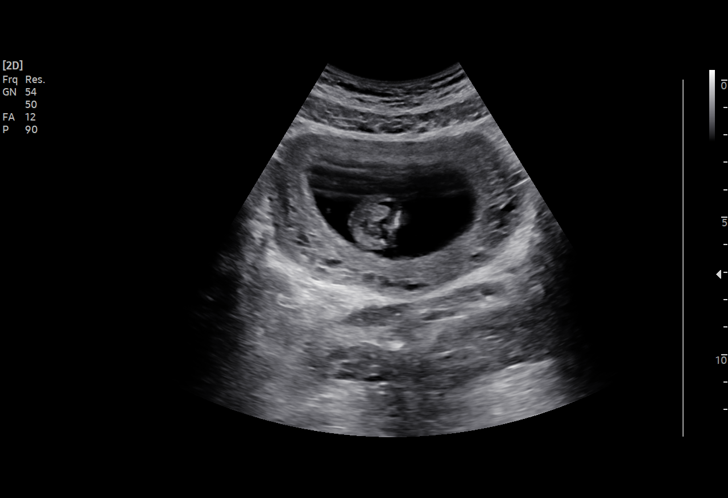
[im 16/29]
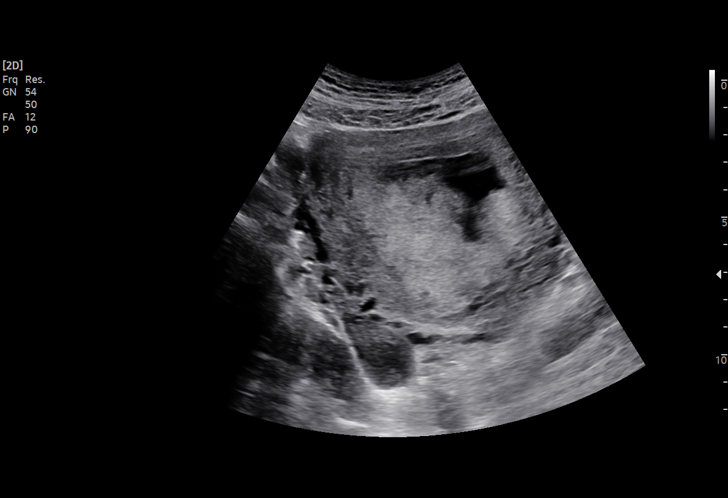
[im 18/29]
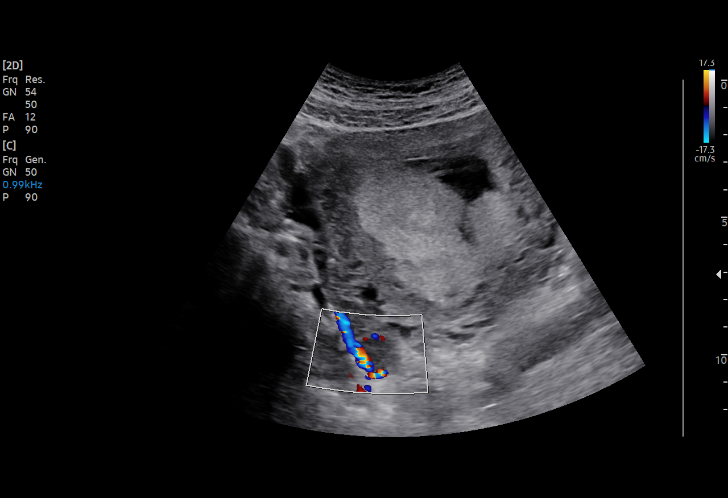
[im 20/29]
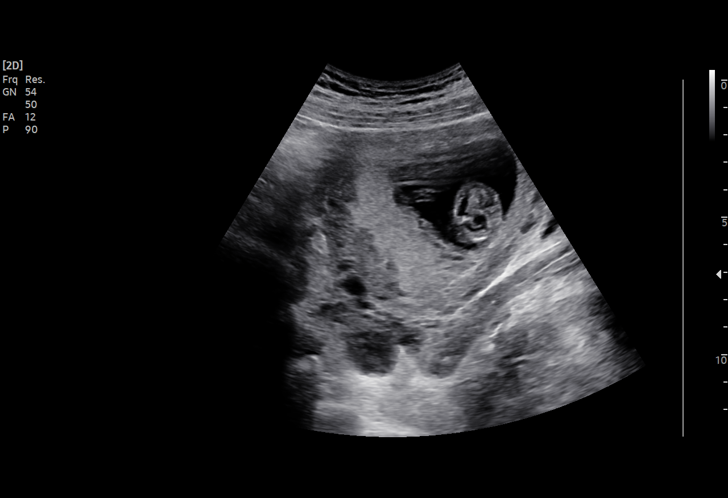
[im 22/29]
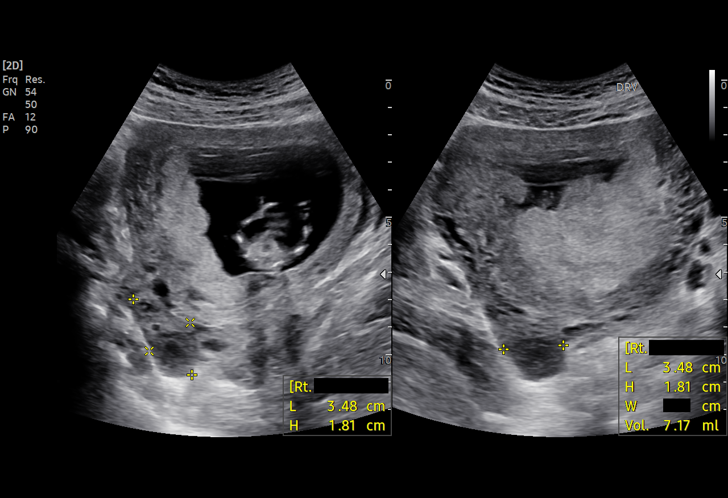
[im 24/29]
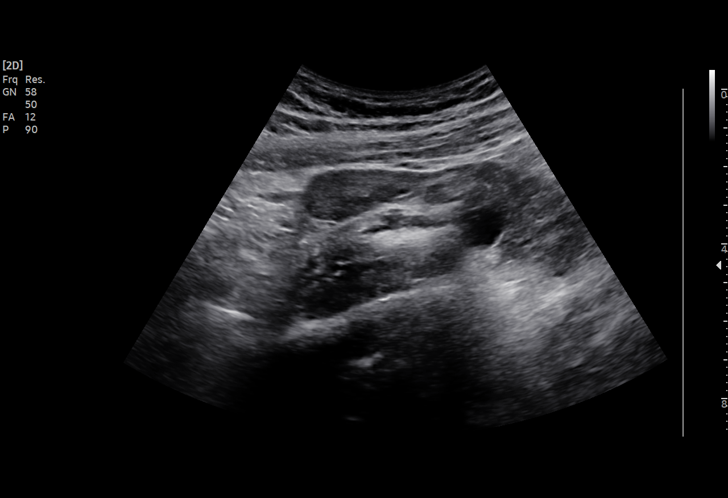
[im 26/29]
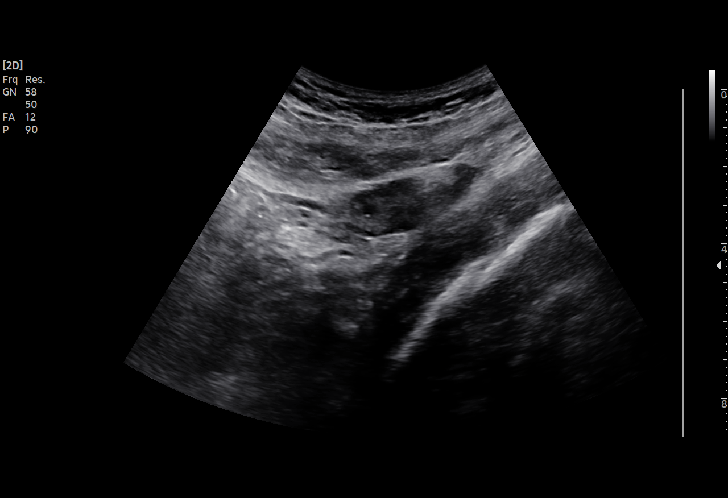
[im 29/29]
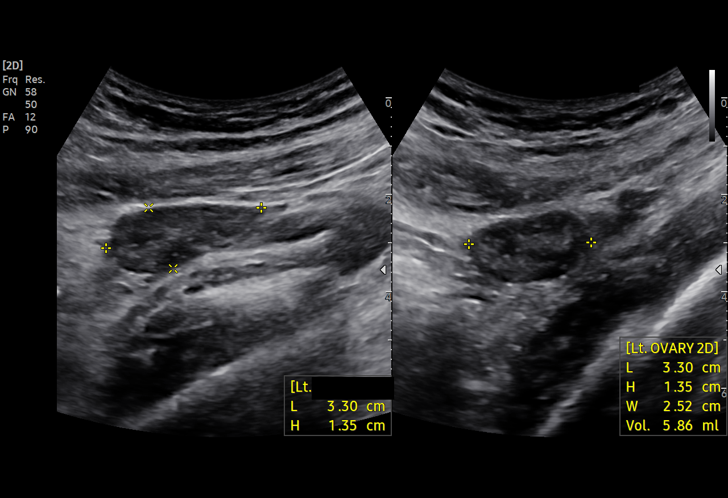

[15 of 28 positions shown; findings below may reference images not displayed]

FINDINGS: Intrauterine gestational sac: Single

Yolk sac:  Not Visualized.

Embryo:  Visualized.

Cardiac Activity: Visualized.

Heart Rate: 166 bpm

CRL: 53.8  mm   12 w 0 d                  US EDC: 08/24/22

Subchorionic hemorrhage:  None visualized.

Maternal uterus/adnexae: Bilateral ovaries are unremarkable. There
is a right corpus luteum cyst.

Otherwise: No free fluid within the pelvis.
IMPRESSION: Single live intrauterine pregnancy with gestational age by
ultrasound of 12 weeks and 0 days which is concordant with
gestational age by last menstrual period of 11 weeks and 2 days.

## 2024-04-26 ENCOUNTER — Telehealth: Admitting: Adult Health

## 2024-04-26 ENCOUNTER — Emergency Department (HOSPITAL_BASED_OUTPATIENT_CLINIC_OR_DEPARTMENT_OTHER)

## 2024-04-26 ENCOUNTER — Other Ambulatory Visit: Payer: Self-pay

## 2024-04-26 ENCOUNTER — Emergency Department (HOSPITAL_BASED_OUTPATIENT_CLINIC_OR_DEPARTMENT_OTHER)
Admission: EM | Admit: 2024-04-26 | Discharge: 2024-04-26 | Disposition: A | Discharge status: Home / Self Care | Attending: Emergency Medicine | Admitting: Emergency Medicine

## 2024-04-26 ENCOUNTER — Encounter: Payer: Self-pay | Admitting: Adult Health

## 2024-04-26 ENCOUNTER — Encounter (HOSPITAL_BASED_OUTPATIENT_CLINIC_OR_DEPARTMENT_OTHER): Payer: Self-pay

## 2024-04-26 DIAGNOSIS — R42 Dizziness and giddiness: Secondary | ICD-10-CM | POA: Diagnosis not present

## 2024-04-26 DIAGNOSIS — Z3A00 Weeks of gestation of pregnancy not specified: Secondary | ICD-10-CM | POA: Diagnosis not present

## 2024-04-26 DIAGNOSIS — R9389 Abnormal findings on diagnostic imaging of other specified body structures: Secondary | ICD-10-CM | POA: Diagnosis not present

## 2024-04-26 DIAGNOSIS — Z3A01 Less than 8 weeks gestation of pregnancy: Secondary | ICD-10-CM | POA: Diagnosis not present

## 2024-04-26 DIAGNOSIS — O3680X0 Pregnancy with inconclusive fetal viability, not applicable or unspecified: Secondary | ICD-10-CM | POA: Diagnosis not present

## 2024-04-26 DIAGNOSIS — F909 Attention-deficit hyperactivity disorder, unspecified type: Secondary | ICD-10-CM

## 2024-04-26 DIAGNOSIS — R109 Unspecified abdominal pain: Secondary | ICD-10-CM | POA: Insufficient documentation

## 2024-04-26 DIAGNOSIS — O209 Hemorrhage in early pregnancy, unspecified: Secondary | ICD-10-CM | POA: Diagnosis not present

## 2024-04-26 LAB — CBC
HCT: 36 % (ref 36.0–46.0)
Hemoglobin: 11.9 g/dL — ABNORMAL LOW (ref 12.0–15.0)
MCH: 28.4 pg (ref 26.0–34.0)
MCHC: 33.1 g/dL (ref 30.0–36.0)
MCV: 85.9 fL (ref 80.0–100.0)
Platelets: 315 10*3/uL (ref 150–400)
RBC: 4.19 MIL/uL (ref 3.87–5.11)
RDW: 14.3 % (ref 11.5–15.5)
WBC: 13.8 10*3/uL — ABNORMAL HIGH (ref 4.0–10.5)
nRBC: 0 % (ref 0.0–0.2)

## 2024-04-26 LAB — HEMOGLOBIN AND HEMATOCRIT, BLOOD
HCT: 32 % — ABNORMAL LOW (ref 36.0–46.0)
Hemoglobin: 10.8 g/dL — ABNORMAL LOW (ref 12.0–15.0)

## 2024-04-26 LAB — HCG, QUANTITATIVE, PREGNANCY: hCG, Beta Chain, Quant, S: 1306 m[IU]/mL — ABNORMAL HIGH (ref <5)

## 2024-04-26 LAB — BASIC METABOLIC PANEL WITH GFR
Anion gap: 13 (ref 5–15)
BUN: 11 mg/dL (ref 6–20)
CO2: 23 mmol/L (ref 22–32)
Calcium: 9.4 mg/dL (ref 8.9–10.3)
Chloride: 101 mmol/L (ref 98–111)
Creatinine, Ser: 0.57 mg/dL (ref 0.44–1.00)
GFR, Estimated: >60 mL/min (ref >60)
Glucose, Bld: 101 mg/dL — ABNORMAL HIGH (ref 70–99)
Potassium: 4 mmol/L (ref 3.5–5.1)
Sodium: 137 mmol/L (ref 135–145)

## 2024-04-26 LAB — PREGNANCY, URINE: Preg Test, Ur: POSITIVE — AB

## 2024-04-26 MED ORDER — SODIUM CHLORIDE 0.9 % IV BOLUS
1000.0000 mL | Freq: Once | INTRAVENOUS | Status: AC
  Administered 2024-04-26: 1000 mL via INTRAVENOUS

## 2024-04-26 MED ORDER — LISDEXAMFETAMINE DIMESYLATE 30 MG PO CAPS: 30.0000 mg | ORAL_CAPSULE | Freq: Every day | ORAL | 0 refills | Status: DC

## 2024-04-26 NOTE — ED Notes (Signed)
 Patient transported to [place]

## 2024-04-26 NOTE — Discharge Instructions (Signed)
 Please read and follow all provided instructions.  Your diagnoses today include:  1. Vaginal bleeding in pregnancy, first trimester    Tests performed today include: Blood cell counts and electrolytes: You do have mild anemia that became slightly worse during your stay, but is not approaching the point where you would need a transfusion Pregnancy hormone: Positive Ultrasound: Did not show the location of your pregnancy and as we discussed this will need to be closely followed to determine if you having a miscarriage or possible ectopic pregnancy Vital signs. See below for your results today.   Medications prescribed:  None  Take any prescribed medications only as directed.  Home care instructions:  Follow any educational materials contained in this packet.  BE VERY CAREFUL not to take multiple medicines containing Tylenol  (also called acetaminophen ). Doing so can lead to an overdose which can damage your liver and cause liver failure and possibly death.   Follow-up instructions: You should go to the [facility] MAU in 48 hours to have your pregnancy hormone rechecked and your recent symptoms recheck.  Return instructions:  Please return to the Emergency Department if you experience worsening symptoms.  Return if you have persistent heavy bleeding or you become more symptomatic such as worsening and more persistent lightheadedness, passing out, shortness of breath Please return if you have any other emergent concerns.  Additional Information:  Your vital signs today were: BP 107/79 (BP Location: Left Arm)   Pulse 73   Temp 98.4 F (36.9 C) (Oral)   Resp 18   LMP 03/15/2024 (Approximate)   SpO2 100%  If your blood pressure (BP) was elevated above 135/85 this visit, please have this repeated by your doctor within one month. --------------

## 2024-04-26 NOTE — ED Triage Notes (Signed)
 Extreme vaginal bleeding started this morning at 9AM  Has been 2 weeks late on menstrual  Saturated through adult diaper Some cramping
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
## 2024-04-26 NOTE — ED Provider Notes (Signed)
 [place] EMERGENCY DEPARTMENT AT [facility] Provider Note   CSN: [id number] Arrival date & time: 04/26/24  1234     History  Chief Complaint  Patient presents with   Vaginal Bleeding    Catherine Tucker is a 28 y.o. female.  Patient presents to the emergency department today for evaluation of vaginal bleeding.  Patient reports vaginal bleeding that started as light bleeding this morning and progressed to very heavy bleeding.  She is wearing an adult diaper and needed to changes out every 30 minutes.  She had some dizziness upon awaking this morning.  No syncope or shortness of breath.  She did miss her last menstrual period which she expected 2 weeks ago.  She suspected that she may have been pregnant but had no confirmatory testing.  She has had some intermittent abdominal cramping's, however not persistent or severe.  No treatments prior to arrival.       Home Medications Prior to Admission medications   Medication Sig Start Date End Date Taking? Authorizing Provider  acetaminophen  (TYLENOL ) 325 MG tablet Take 2 tablets (650 mg total) by mouth every 4 (four) hours as needed (for pain scale < 4). 08/21/22   Arlee Lace, MD  hydrOXYzine  (ATARAX ) 10 MG tablet Take 1 tablet (10 mg total) by mouth 3 (three) times daily as needed. 03/03/22   Mozingo, Regina Nattalie, NP  ibuprofen  (ADVIL ) 600 MG tablet Take 1 tablet (600 mg total) by mouth every 6 (six) hours as needed. 08/21/22   Arlee Lace, MD  lisdexamfetamine (VYVANSE ) 30 MG capsule Take 1 capsule (30 mg total) by mouth daily. 04/26/24   Mozingo, Regina Nattalie, NP  Prenatal Vit-Fe Fumarate-FA (PRENATAL MULTIVITAMIN) TABS tablet Take 1 tablet by mouth daily at 12 noon.    [provider]  progesterone  (PROMETRIUM ) 200 MG capsule Place one capsule vaginally at bedtime 02/13/22   Anyanwu, Ugonna A, MD      Allergies    Patient has no known allergies.    Review of Systems   Review of Systems  Physical Exam Updated  Vital Signs BP 123/89 (BP Location: Right Arm)   Pulse 69   Temp 97.6 F (36.4 C)   Resp 16   LMP 03/15/2024 (Approximate)   SpO2 100%  Physical Exam Vitals and nursing note reviewed. Exam conducted with a chaperone present.  Constitutional:      Appearance: She is well-developed.  HENT:     Head: Normocephalic and atraumatic.  Eyes:     Conjunctiva/sclera: Conjunctivae normal.  Pulmonary:     Effort: No respiratory distress.  Genitourinary:    Exam position: Lithotomy position.     Vagina: Bleeding present.     Adnexa:        Right: No tenderness.         Left: No tenderness.       Comments: Significant pulling with clots noted in the vaginal vault, unable to be cleared with several large cotton swabs.  Cervix palpable, seems to be closed on bimanual. Musculoskeletal:     Cervical back: Normal range of motion and neck supple.  Skin:    General: Skin is warm and dry.  Neurological:     Mental Status: She is alert.     ED Results / Procedures / Treatments   Labs (all labs ordered are listed, but only abnormal results are displayed) Labs Reviewed  PREGNANCY, URINE - Abnormal; Notable for the following components:      Result Value  Preg Test, Ur POSITIVE (*)    All other components within normal limits  CBC - Abnormal; Notable for the following components:   WBC 13.8 (*)    Hemoglobin 11.9 (*)    All other components within normal limits  BASIC METABOLIC PANEL WITH GFR - Abnormal; Notable for the following components:   Glucose, Bld 101 (*)    All other components within normal limits  HCG, QUANTITATIVE, PREGNANCY - Abnormal; Notable for the following components:   hCG, Beta Chain, Quant, S 1,306 (*)    All other components within normal limits  HEMOGLOBIN AND HEMATOCRIT, BLOOD    EKG None  Radiology US  OB LESS THAN 14 WEEKS WITH OB TRANSVAGINAL Result Date: 04/26/2024 CLINICAL DATA:  Pregnant patient with vaginal bleeding. EXAM: OBSTETRIC <14 WK US  AND  TRANSVAGINAL OB US  TECHNIQUE: Both transabdominal and transvaginal ultrasound examinations were performed for complete evaluation of the gestation as well as the maternal uterus, adnexal regions, and pelvic cul-de-sac. Transvaginal technique was performed to assess early pregnancy. COMPARISON:  None Available. FINDINGS: Intrauterine gestational sac: None Yolk sac:  Not Visualized. Embryo:  Not Visualized. Cardiac Activity: Not Visualized. Maternal uterus/adnexae: Thickened heterogeneous endometrium. 1.8 cm corpus luteum right ovary. Normal left ovary. Trace free fluid in the pelvis. IMPRESSION: No intrauterine gestation identified. In the setting of positive pregnancy test and no definite intrauterine pregnancy, this reflects a pregnancy of unknown location. Differential considerations include early normal IUP, abnormal IUP, or nonvisualized ectopic pregnancy. Differentiation is achieved with serial beta HCG supplemented by repeat sonography as clinically warranted. Electronically Signed   By: Jone Neither M.D.   On: 04/26/2024 16:11    Procedures Procedures    Medications Ordered in ED Medications  sodium chloride  0.9 % bolus 1,000 mL (1,000 mLs Intravenous New Bag/Given 04/26/24 1710)    ED Course/ Medical Decision Making/ A&P    Patient seen and examined. History obtained directly from patient.  Pelvic exam performed with RN chaperone.  Patient stable, standing without lightheadedness.  Labs/EKG: Ordered CBC, BMP, pregnancy, quantitative hCG.  Imaging: Ultrasound  Medications/Fluids: None ordered.  Most recent vital signs reviewed and are as follows: BP 123/89 (BP Location: Right Arm)   Pulse 69   Temp 97.6 F (36.4 C)   Resp 16   LMP 03/15/2024 (Approximate)   SpO2 100%   Initial impression: Vaginal bleeding and likely early pregnancy.  2:44 PM lab workup reviewed and interpreted personally including CBC with elevated white blood cell count at 13.8, hemoglobin 11.9 otherwise  unremarkable; BMP unremarkable; quantitative hCG 1306 with positive urine pregnancy test.  Ultrasound currently being performed.  5:05 PM Reassessment performed. Patient appears stable.  Husband at bedside.  We discussed results.  Labs personally reviewed and interpreted including: H&H ordered to re-evaluate.   Imaging results including: Ultrasound results reviewed, no definite IUP noted.  Reviewed pertinent lab work and imaging with patient at bedside. Questions answered.  Discussed that results could be due to ongoing miscarriage versus early IUP versus nonvisualized ectopic pregnancy.  While talking, patient became lightheaded, pale, diaphoretic.  Pulse rate remained normal.  Suspect vasovagal spell.  Most current vital signs reviewed and are as follows: BP 107/79 (BP Location: Left Arm)   Pulse 73   Temp 98.4 F (36.9 C) (Oral)   Resp 18   LMP 03/15/2024 (Approximate)   SpO2 100%   Plan: Fluid bolus, orthostatic vitals, recheck H&H.  Patient discussed with Dr. Florie Husband who will see as well.  She will need 48-hour  recheck in beta-hCG.  Can likely do [facility] at [facility].  Given heavy bleeding, would like to make sure that she is safe for discharge and that her vital signs are stable and hemoglobin is stable.  6:33 PM Reassessment performed. Patient appears stable. Cleared for dc with Dr. Florie Husband. Plan for MAU follow-up in 2 days for beta-hCG recheck, sooner if worsening.  Labs personally reviewed and interpreted including: Hemoglobin   Reviewed pertinent lab work and imaging with patient at bedside. Questions answered.   Most current vital signs reviewed and are as follows: BP 107/79 (BP Location: Left Arm)   Pulse 73   Temp 98.4 F (36.9 C) (Oral)   Resp 18   LMP 03/15/2024 (Approximate)   SpO2 100%   Plan: Discharge to home.   Prescriptions written for: None  Other home care instructions discussed: Close monitoring of symptoms  ED return instructions discussed:  Worsening persistent lightheadedness, shortness of breath, syncope, prolonged heavy bleeding.  Follow-up instructions discussed: Patient encouraged to follow-up with [facility] in 48 hours for beta-hCG and hemoglobin recheck.                                 Medical Decision Making Amount and/or Complexity of Data Reviewed Labs: ordered. Radiology: ordered.   Patient in first trimester of pregnancy with heavy vaginal bleeding starting this morning.  Likely represents a miscarriage however workup for ectopic pregnancy performed.  Unable to locate IUP on ultrasound.  Beta-hCG is currently in the discriminatory zone.  Possible spontaneous miscarriage versus nonvisualized ectopic versus early IUP.  Patient did have heavy bleeding, appears to be slowing during ED stay.  Over 6-hour ED stay, hemoglobin dropped 1 point.  Patient continues to have normal vital signs.  Considered admission however patient stable and well appearing. She will need to watch her symptoms very carefully.  If heavier bleeding or more symptomatic, will need to return for hemoglobin recheck.  Otherwise follow-up with MAU in 48 hours for beta-hCG recheck and hemoglobin recheck.  Patient discussed with and seen by Dr. Florie Husband.   The patient's vital signs, pertinent lab work and imaging were reviewed and interpreted as discussed in the ED course. Hospitalization was considered for further testing, treatments, or serial exams/observation. However as patient is well-appearing, has a stable exam, I do not feel that they warrant admission at this time. This plan was discussed with the patient who verbalizes agreement and comfort with this plan and seems reliable and able to return to the Emergency Department with worsening or changing symptoms.          Final Clinical Impression(s) / ED Diagnoses Final diagnoses:  Vaginal bleeding in pregnancy, first trimester    Rx / DC Orders ED Discharge Orders     None          Lyna Sandhoff, PA-C 04/26/24 1837    Afton Horse T, DO 04/28/24 1540

## 2024-04-29 ENCOUNTER — Telehealth: Payer: Self-pay | Admitting: Emergency Medicine

## 2024-04-29 ENCOUNTER — Ambulatory Visit

## 2024-04-29 NOTE — Progress Notes (Incomplete)
 Pt called today from UC to let her know with her symptom with increase vaginal bleed, dizziness and fating spells she needs a higher level of care. Pt states she will go to another UC, that she didn't have a good experience on the ED and she doesn't want to return there.

## 2024-08-15 ENCOUNTER — Telehealth: Payer: Self-pay | Admitting: Adult Health

## 2024-08-15 NOTE — Telephone Encounter (Signed)
 Pt called at 2:12p requested refill of Vyvanse  to   [id number] [address], [facility] - [address], [place] [address] Phone: [phone]  Fax: [phone]   Next appt 9/10

## 2024-08-16 ENCOUNTER — Other Ambulatory Visit: Payer: Self-pay

## 2024-08-16 DIAGNOSIS — F909 Attention-deficit hyperactivity disorder, unspecified type: Secondary | ICD-10-CM

## 2024-08-16 MED ORDER — LISDEXAMFETAMINE DIMESYLATE 30 MG PO CAPS: 30.0000 mg | ORAL_CAPSULE | Freq: Every day | ORAL | 0 refills | Status: DC

## 2024-08-16 NOTE — Telephone Encounter (Signed)
Pended enough to appt.  

## 2024-08-28 ENCOUNTER — Encounter: Payer: Self-pay | Admitting: Adult Health

## 2024-08-28 ENCOUNTER — Telehealth: Admitting: Adult Health

## 2024-08-28 DIAGNOSIS — F909 Attention-deficit hyperactivity disorder, unspecified type: Secondary | ICD-10-CM

## 2024-08-28 MED ORDER — LISDEXAMFETAMINE DIMESYLATE 30 MG PO CAPS: 30.0000 mg | ORAL_CAPSULE | Freq: Every day | ORAL | 0 refills | Status: DC
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
## 2024-09-26 ENCOUNTER — Ambulatory Visit: Admitting: Nurse Practitioner

## 2024-09-26 DIAGNOSIS — Z0001 Encounter for general adult medical examination with abnormal findings: Secondary | ICD-10-CM

## 2024-09-27 ENCOUNTER — Encounter: Payer: Self-pay | Admitting: General Practice

## 2024-09-27 DIAGNOSIS — N61 Mastitis without abscess: Secondary | ICD-10-CM | POA: Diagnosis not present

## 2024-10-02 DIAGNOSIS — N611 Abscess of the breast and nipple: Secondary | ICD-10-CM | POA: Diagnosis not present

## 2024-11-21 DIAGNOSIS — R079 Chest pain, unspecified: Secondary | ICD-10-CM | POA: Diagnosis not present

## 2024-11-21 DIAGNOSIS — M940 Chondrocostal junction syndrome [Tietze]: Secondary | ICD-10-CM | POA: Diagnosis not present

## 2024-12-25 ENCOUNTER — Telehealth: Payer: Self-pay | Admitting: Adult Health

## 2024-12-25 NOTE — Telephone Encounter (Signed)
 Patient called in for refill on Vyvanse  30mg . Ph: [phone] Appt 1/8 Pharmacy [facility] [address] [place]

## 2024-12-26 ENCOUNTER — Telehealth: Admitting: Adult Health

## 2024-12-26 ENCOUNTER — Encounter: Payer: Self-pay | Admitting: Adult Health

## 2024-12-26 DIAGNOSIS — F411 Generalized anxiety disorder: Secondary | ICD-10-CM | POA: Diagnosis not present

## 2024-12-26 DIAGNOSIS — F909 Attention-deficit hyperactivity disorder, unspecified type: Secondary | ICD-10-CM

## 2024-12-26 MED ORDER — LISDEXAMFETAMINE DIMESYLATE 30 MG PO CAPS: 30.0000 mg | ORAL_CAPSULE | Freq: Every day | ORAL | 0 refills | Status: AC

## 2024-12-26 MED ORDER — BUSPIRONE HCL 5 MG PO TABS: 5.0000 mg | ORAL_TABLET | Freq: Three times a day (TID) | ORAL | 2 refills | Status: AC
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
## 2024-12-26 NOTE — Telephone Encounter (Signed)
 LF 10/21, has a RF with start date of 11/02/24 at [facility] [address] Rx was sent 08/28/24. Please notify patient.

## 2025-01-17 ENCOUNTER — Other Ambulatory Visit: Payer: Self-pay | Admitting: Adult Health

## 2025-01-17 DIAGNOSIS — F411 Generalized anxiety disorder: Secondary | ICD-10-CM
